# Patient Record
Sex: Female | Born: 1965 | Race: Black or African American | Hispanic: No | Marital: Single | Smoking: Former smoker
Health system: Southern US, Community
[De-identification: ages and names within clinical notes are randomized; demographics above are authoritative.]

## PROBLEM LIST (undated history)

## (undated) DIAGNOSIS — C801 Malignant (primary) neoplasm, unspecified: Secondary | ICD-10-CM

## (undated) DIAGNOSIS — M1990 Unspecified osteoarthritis, unspecified site: Secondary | ICD-10-CM

## (undated) DIAGNOSIS — E119 Type 2 diabetes mellitus without complications: Secondary | ICD-10-CM

## (undated) DIAGNOSIS — I10 Essential (primary) hypertension: Secondary | ICD-10-CM

## (undated) HISTORY — DX: Malignant (primary) neoplasm, unspecified: C80.1

## (undated) HISTORY — PX: ANKLE SURGERY: SHX546

---

## 2015-06-02 ENCOUNTER — Encounter (HOSPITAL_COMMUNITY): Payer: Self-pay | Admitting: Emergency Medicine

## 2015-06-02 ENCOUNTER — Emergency Department (HOSPITAL_COMMUNITY)
Admission: EM | Admit: 2015-06-02 | Discharge: 2015-06-02 | Disposition: A | Discharge status: Home / Self Care | Payer: BLUE CROSS/BLUE SHIELD | Attending: Emergency Medicine | Admitting: Emergency Medicine

## 2015-06-02 ENCOUNTER — Emergency Department (HOSPITAL_COMMUNITY): Payer: BLUE CROSS/BLUE SHIELD

## 2015-06-02 DIAGNOSIS — J111 Influenza due to unidentified influenza virus with other respiratory manifestations: Secondary | ICD-10-CM | POA: Insufficient documentation

## 2015-06-02 DIAGNOSIS — R05 Cough: Secondary | ICD-10-CM | POA: Diagnosis present

## 2015-06-02 DIAGNOSIS — Z7901 Long term (current) use of anticoagulants: Secondary | ICD-10-CM | POA: Insufficient documentation

## 2015-06-02 DIAGNOSIS — Z79899 Other long term (current) drug therapy: Secondary | ICD-10-CM | POA: Insufficient documentation

## 2015-06-02 DIAGNOSIS — R197 Diarrhea, unspecified: Secondary | ICD-10-CM | POA: Diagnosis not present

## 2015-06-02 DIAGNOSIS — R6889 Other general symptoms and signs: Secondary | ICD-10-CM

## 2015-06-02 DIAGNOSIS — Z87891 Personal history of nicotine dependence: Secondary | ICD-10-CM | POA: Diagnosis not present

## 2015-06-02 DIAGNOSIS — E119 Type 2 diabetes mellitus without complications: Secondary | ICD-10-CM | POA: Insufficient documentation

## 2015-06-02 DIAGNOSIS — I10 Essential (primary) hypertension: Secondary | ICD-10-CM | POA: Diagnosis not present

## 2015-06-02 DIAGNOSIS — Z791 Long term (current) use of non-steroidal anti-inflammatories (NSAID): Secondary | ICD-10-CM | POA: Insufficient documentation

## 2015-06-02 HISTORY — DX: Essential (primary) hypertension: I10

## 2015-06-02 HISTORY — DX: Type 2 diabetes mellitus without complications: E11.9

## 2015-06-02 LAB — URINALYSIS, ROUTINE W REFLEX MICROSCOPIC
Bilirubin Urine: NEGATIVE
Glucose, UA: NEGATIVE mg/dL
Leukocytes, UA: NEGATIVE
Nitrite: NEGATIVE
Protein, ur: 30 mg/dL — AB
Specific Gravity, Urine: >1.03 — ABNORMAL HIGH (ref 1.005–1.030)
pH: 5.5 (ref 5.0–8.0)

## 2015-06-02 LAB — CBC WITH DIFFERENTIAL/PLATELET
Basophils Absolute: 0 10*3/uL (ref 0.0–0.1)
Basophils Relative: 1 %
Eosinophils Absolute: 0.1 10*3/uL (ref 0.0–0.7)
Eosinophils Relative: 4 %
HCT: 39 % (ref 36.0–46.0)
Hemoglobin: 12.9 g/dL (ref 12.0–15.0)
Lymphocytes Relative: 51 %
Lymphs Abs: 1.7 10*3/uL (ref 0.7–4.0)
MCH: 27.4 pg (ref 26.0–34.0)
MCHC: 33.1 g/dL (ref 30.0–36.0)
MCV: 83 fL (ref 78.0–100.0)
Monocytes Absolute: 0.6 10*3/uL (ref 0.1–1.0)
Monocytes Relative: 18 %
Neutro Abs: 0.9 10*3/uL — ABNORMAL LOW (ref 1.7–7.7)
Neutrophils Relative %: 26 %
Platelets: 274 10*3/uL (ref 150–400)
RBC: 4.7 MIL/uL (ref 3.87–5.11)
RDW: 13.9 % (ref 11.5–15.5)
WBC: 3.4 10*3/uL — ABNORMAL LOW (ref 4.0–10.5)

## 2015-06-02 LAB — COMPREHENSIVE METABOLIC PANEL
ALT: 14 U/L (ref 14–54)
AST: 17 U/L (ref 15–41)
Albumin: 3.9 g/dL (ref 3.5–5.0)
Alkaline Phosphatase: 48 U/L (ref 38–126)
Anion gap: 6 (ref 5–15)
BUN: 12 mg/dL (ref 6–20)
CO2: 27 mmol/L (ref 22–32)
Calcium: 9.5 mg/dL (ref 8.9–10.3)
Chloride: 105 mmol/L (ref 101–111)
Creatinine, Ser: 0.87 mg/dL (ref 0.44–1.00)
GFR calc Af Amer: >60 mL/min (ref >60)
GFR calc non Af Amer: >60 mL/min (ref >60)
Glucose, Bld: 106 mg/dL — ABNORMAL HIGH (ref 65–99)
Potassium: 4.4 mmol/L (ref 3.5–5.1)
Sodium: 138 mmol/L (ref 135–145)
Total Bilirubin: 0.4 mg/dL (ref 0.3–1.2)
Total Protein: 7.6 g/dL (ref 6.5–8.1)

## 2015-06-02 LAB — URINE MICROSCOPIC-ADD ON

## 2015-06-02 LAB — LIPASE, BLOOD: Lipase: 23 U/L (ref 11–51)

## 2015-06-02 MED ORDER — DIPHENOXYLATE-ATROPINE 2.5-0.025 MG PO TABS
2.0000 | ORAL_TABLET | Freq: Once | ORAL | Status: AC
Start: 2015-06-02 — End: 2015-06-02
  Administered 2015-06-02: 2 via ORAL
  Filled 2015-06-02: qty 2

## 2015-06-02 MED ORDER — ALBUTEROL SULFATE HFA 108 (90 BASE) MCG/ACT IN AERS
2.0000 | INHALATION_SPRAY | Freq: Once | RESPIRATORY_TRACT | Status: AC
  Administered 2015-06-02: 2 via RESPIRATORY_TRACT
  Filled 2015-06-02: qty 6.7

## 2015-06-02 MED ORDER — DIPHENOXYLATE-ATROPINE 2.5-0.025 MG PO TABS: 1.0000 | ORAL_TABLET | Freq: Four times a day (QID) | ORAL | Status: DC | PRN

## 2015-06-02 NOTE — ED Provider Notes (Signed)
CSN: [id number]     Arrival date & time 06/02/15  1036 History   First MD Initiated Contact with Patient 06/02/15 1152     Chief Complaint  Patient presents with  . Cough     (Consider location/radiation/quality/duration/timing/severity/associated sxs/prior Treatment) The history is provided by the patient.   Brittany Mack is a 50 y.o. female presenting with a nearly 2 week history of URI type symptoms which has progressed into acute bronchitis.  She endorses nasal congestion with clear rhinorrhea initially along with a mild sore throat and since then her upper respiratory symptoms have improved but she has developed a wet sounding cough which is occasionally productive of a yellow sputum.  In addition she has burning for chest pain with cough only.  She denies hemoptysis.  She was seen by a provider at an outside ER on Monday and was prescribed a Z-Pak along with Tessalon and Phenergan with codeine cough syrup but endorses no improvement in her symptoms.  She denies nausea or vomiting but has developed diarrhea since yesterday stating having brown watery stool every 15 minutes for the past 24 hours.  She endorses general weakness and fatigue.  She is able to tolerate by mouth intake without worsening symptoms.  She has taken no medications for her diarrhea.  She is a diabetic which is diet controlled.     Past Medical History  Diagnosis Date  . Hypertension   . Diabetes mellitus without complication [facility])    Past Surgical History  Procedure Laterality Date  . Cesarean section    . Ankle surgery      right   Family History  Problem Relation Age of Onset  . Rheum arthritis Mother   . Hypertension Mother   . Stroke Father   . Heart failure Father   . Hypertension Sister   . Diabetes Brother    Social History  Substance Use Topics  . Smoking status: Former Smoker    Quit date: 06/02/1982  . Smokeless tobacco: None  . Alcohol Use: No   OB History    No data available      Review of Systems  Constitutional: Positive for fatigue. Negative for fever.  HENT: Negative for congestion and sore throat.   Eyes: Negative.   Respiratory: Positive for cough. Negative for chest tightness, shortness of breath, wheezing and stridor.   Cardiovascular: Negative for chest pain and palpitations.  Gastrointestinal: Positive for diarrhea. Negative for nausea, vomiting and abdominal pain.  Genitourinary: Negative.   Musculoskeletal: Negative for joint swelling, arthralgias and neck pain.  Skin: Negative.  Negative for rash and wound.  Neurological: Positive for weakness. Negative for dizziness, light-headedness, numbness and headaches.  Psychiatric/Behavioral: Negative.       Allergies  Review of patient's allergies indicates no known allergies.  Home Medications   Prior to Admission medications   Medication Sig Start Date End Date Taking? Authorizing Provider  amlodipine-olmesartan (AZOR) 10-20 MG tablet Take 1 tablet by mouth daily.   Yes Historical Provider, MD  azithromycin (ZITHROMAX) 250 MG tablet Take 250 mg by mouth daily.   Yes Historical Provider, MD  benzonatate (TESSALON) 100 MG capsule Take 100 mg by mouth every 8 (eight) hours as needed for cough.   Yes Historical Provider, MD  ibuprofen (ADVIL,MOTRIN) 600 MG tablet Take 600 mg by mouth every 6 (six) hours as needed for moderate pain.   Yes Historical Provider, MD  naproxen sodium (ANAPROX) 220 MG tablet Take 440 mg by mouth daily as needed (  pain).   Yes Historical Provider, MD  promethazine-dextromethorphan (PROMETHAZINE-DM) 6.25-15 MG/5ML syrup Take 10 mLs by mouth every 6 (six) hours as needed for cough.   Yes Historical Provider, MD  diphenoxylate-atropine (LOMOTIL) 2.5-0.025 MG tablet Take 1 tablet by mouth 4 (four) times daily as needed for diarrhea or loose stools. 06/02/15   Evalee Jefferson, PA-C   BP 125/77 mmHg  Pulse 74  Temp(Src) 98.2 F (36.8 C) (Oral)  Resp 16  Ht 5\' 6"  (1.676 m)  Wt 88.451 kg   BMI 31.49 kg/m2  SpO2 90% Physical Exam  Constitutional: She appears well-developed and well-nourished.  HENT:  Head: Normocephalic and atraumatic.  Eyes: Conjunctivae are normal.  Neck: Normal range of motion.  Cardiovascular: Normal rate, regular rhythm, normal heart sounds and intact distal pulses.   Pulmonary/Chest: Effort normal. She has no decreased breath sounds. She has wheezes in the left upper field. She has no rhonchi. She has no rales.  Localized wheeze clears with cough.  Abdominal: Soft. Bowel sounds are normal. There is no tenderness.  Musculoskeletal: Normal range of motion.  Neurological: She is alert.  Skin: Skin is warm and dry.  Psychiatric: She has a normal mood and affect.  Nursing note and vitals reviewed.   ED Course  Procedures (including critical care time) Labs Review Labs Reviewed  COMPREHENSIVE METABOLIC PANEL - Abnormal; Notable for the following:    Glucose, Bld 106 (*)    All other components within normal limits  CBC WITH DIFFERENTIAL/PLATELET - Abnormal; Notable for the following:    WBC 3.4 (*)    Neutro Abs 0.9 (*)    All other components within normal limits  URINALYSIS, ROUTINE W REFLEX MICROSCOPIC (NOT AT [facility]) - Abnormal; Notable for the following:    Specific Gravity, Urine >1.030 (*)    Hgb urine dipstick LARGE (*)    Ketones, ur TRACE (*)    Protein, ur 30 (*)    All other components within normal limits  URINE MICROSCOPIC-ADD ON - Abnormal; Notable for the following:    Squamous Epithelial / LPF 6-30 (*)    Bacteria, UA MANY (*)    All other components within normal limits  URINE CULTURE  LIPASE, BLOOD    Imaging Review Dg Abd Acute W/chest  06/02/2015  CLINICAL DATA:  Productive cough and shortness of breath for 2 weeks EXAM: DG ABDOMEN ACUTE W/ 1V CHEST COMPARISON:  None. FINDINGS: There is no evidence of dilated bowel loops or free intraperitoneal air. No radiopaque calculi or other significant radiographic abnormality is  seen. Heart size and mediastinal contours are within normal limits. Both lungs are clear. IMPRESSION: Negative abdominal radiographs.  No acute cardiopulmonary disease. Electronically Signed   By: Inez Catalina M.D.   On: 06/02/2015 12:56   I have personally reviewed and evaluated these images and lab results as part of my medical decision-making.   EKG Interpretation None      MDM   Final diagnoses:  Flu-like symptoms  Diarrhea, unspecified type    Patients  labs reviewed.  Radiological studies were viewed, interpreted and considered during the medical decision making and disposition process. I agree with radiologists reading.  Results were also discussed with patient. No signs of either pneumonia or acute bronchitis on todays imaging.  Suspect diarrhea may be side effect of her zithromax.  Lomotil given, advised to hold zpack.  Do not feel indicated and is causing undo side effects, although has had no diarrhea since arriving here.  Prescribed lomotil,  first dose given here, advised continue to take only if diarrhea persists.  Yogurt. Albuterol mdi given, q 4 hours prn.  Rest, increased fluid intake.    Discussed urine results, suspect contamination, cx sent.  Denies urinary sx.  The patient appears reasonably screened and/or stabilized for discharge and I doubt any other medical condition or other [facility] requiring further screening, evaluation, or treatment in the ED at this time prior to discharge.      Evalee Jefferson, PA-C 06/02/15 [place], DO 06/06/15 [phone]

## 2015-06-02 NOTE — ED Notes (Signed)
Having cold symptoms for 2 weeks/  Seen at [facility] on Friday and treated couagh medication and motrin.  Continued to have symptoms and was treated with antibiotic on Monday.  Pt here because is still not feeling any better.  Pt don't have PCP.  Cough (yellowish), diarrhea, chills, weak and headache occasionally.  Did not take antibiotic this am but did yesterday morning.

## 2015-06-02 NOTE — Discharge Instructions (Signed)
Diarrhea Diarrhea is frequent loose and watery bowel movements. It can cause you to feel weak and dehydrated. Dehydration can cause you to become tired and thirsty, have a dry mouth, and have decreased urination that often is dark yellow. Diarrhea is a sign of another problem, most often an infection that will not last long. In most cases, diarrhea typically lasts 2-3 days. However, it can last longer if it is a sign of something more serious. It is important to treat your diarrhea as directed by your caregiver to lessen or prevent future episodes of diarrhea. CAUSES  Some common causes include:  Gastrointestinal infections caused by viruses, bacteria, or parasites.  Food poisoning or food allergies.  Certain medicines, such as antibiotics, chemotherapy, and laxatives.  Artificial sweeteners and fructose.  Digestive disorders. HOME CARE INSTRUCTIONS  Ensure adequate fluid intake (hydration): Have 1 cup (8 oz) of fluid for each diarrhea episode. Avoid fluids that contain simple sugars or sports drinks, fruit juices, whole milk products, and sodas. Your urine should be clear or pale yellow if you are drinking enough fluids. Hydrate with an oral rehydration solution that you can purchase at pharmacies, retail stores, and online. You can prepare an oral rehydration solution at home by mixing the following ingredients together:   - tsp table salt.   tsp baking soda.   tsp salt substitute containing potassium chloride.  1  tablespoons sugar.  1 L (34 oz) of water.  Certain foods and beverages may increase the speed at which food moves through the gastrointestinal (GI) tract. These foods and beverages should be avoided and include:  Caffeinated and alcoholic beverages.  High-fiber foods, such as raw fruits and vegetables, nuts, seeds, and whole grain breads and cereals.  Foods and beverages sweetened with sugar alcohols, such as xylitol, sorbitol, and mannitol.  Some foods may be well  tolerated and may help thicken stool including:  Starchy foods, such as rice, toast, pasta, low-sugar cereal, oatmeal, grits, baked potatoes, crackers, and bagels.  Bananas.  Applesauce.  Add probiotic-rich foods to help increase healthy bacteria in the GI tract, such as yogurt and fermented milk products.  Wash your hands well after each diarrhea episode.  Only take over-the-counter or prescription medicines as directed by your caregiver.  Take a warm bath to relieve any burning or pain from frequent diarrhea episodes. SEEK IMMEDIATE MEDICAL CARE IF:   You are unable to keep fluids down.  You have persistent vomiting.  You have blood in your stool, or your stools are black and tarry.  You do not urinate in 6-8 hours, or there is only a small amount of very dark urine.  You have abdominal pain that increases or localizes.  You have weakness, dizziness, confusion, or light-headedness.  You have a severe headache.  Your diarrhea gets worse or does not get better.  You have a fever or persistent symptoms for more than 2-3 days.  You have a fever and your symptoms suddenly get worse. MAKE SURE YOU:   Understand these instructions.  Will watch your condition.  Will get help right away if you are not doing well or get worse.   This information is not intended to replace advice given to you by your health care provider. Make sure you discuss any questions you have with your health care provider.   Document Released: 03/02/2002 Document Revised: 04/02/2014 Document Reviewed: 11/18/2011 Elsevier Interactive Patient Education [employer].   As discussed, I believe your diarrhea may be due to  the antibiotic.  As your chest x-ray today is clear, I recommend holding your last 2 doses of Zithromax.  You may continue to take the Lomotil prescribed today but only if you continue to have diarrhea as his medication will make you constipated if you continue to use it without  continued diarrhea symptoms.  I recommend eating a cup of yogurt daily which may also help with your diarrhea.  Use the albuterol inhaler 2 puffs every 4 hours if you are coughing or wheezing.  Plan a recheck if your symptoms persist or worsen.  Your lab tests and x-rays are okay today.  As discussed your urine has been sent for culture and this test will take 2 days to return.  You'll be notified if you need any further medications depending on the results of this culture.

## 2015-06-02 NOTE — ED Notes (Signed)
RT paged.

## 2015-06-05 LAB — URINE CULTURE
Culture: 60000
Special Requests: NORMAL

## 2015-06-06 ENCOUNTER — Telehealth (HOSPITAL_BASED_OUTPATIENT_CLINIC_OR_DEPARTMENT_OTHER): Payer: Self-pay | Admitting: Emergency Medicine

## 2015-06-06 NOTE — Telephone Encounter (Signed)
Post ED Visit - Positive Culture Follow-up: Successful Patient Follow-Up  Culture assessed and recommendations reviewed by: []  Elenor Quinones, Pharm.D. []  Heide Guile, Pharm.D., BCPS []  Parks Neptune, Pharm.D. []  Alycia Rossetti, Pharm.D., BCPS []  [place], Pharm.D., BCPS, AAHIVP []  Legrand Como, Pharm.D., BCPS, AAHIVP []  Milus Glazier, Pharm.D. []  Rob Tropic, [place].Daiva Huge PharmD  Positive urine culture E. Coli 60,000 colonies  [x]  Patient discharged without antimicrobial prescription and treatment is now indicated []  Organism is resistant to prescribed ED discharge antimicrobial []  Patient with positive blood cultures  Changes discussed with ED provider: symptom check, if symptoms of UTI, give Bactrim DS 1 tab bid x  3 days  Attempting to call patient    Hazle Nordmann 06/06/2015, 12:35 PM

## 2015-06-06 NOTE — Progress Notes (Signed)
ED Antimicrobial Stewardship Positive Culture Follow Up   Brittany Mack is an 50 y.o. female who presented to [facility] on 06/02/2015 with a chief complaint of  Chief Complaint  Patient presents with  . Cough    Recent Results (from the past 720 hour(s))  Urine culture     Status: None   Collection Time: 06/02/15  1:42 PM  Result Value Ref Range Status   Specimen Description URINE, RANDOM  Final   Special Requests Normal  Final   Culture   Final    60,000 COLONIES/ml ESCHERICHIA COLI Performed at [facility]    Report Status 06/05/2015 FINAL  Final   Organism ID, Bacteria ESCHERICHIA COLI  Final      Susceptibility   Escherichia coli - MIC*    AMPICILLIN <=2 SENSITIVE Sensitive     CEFAZOLIN <=4 SENSITIVE Sensitive     CEFTRIAXONE <=1 SENSITIVE Sensitive     CIPROFLOXACIN <=0.25 SENSITIVE Sensitive     GENTAMICIN <=1 SENSITIVE Sensitive     IMIPENEM <=0.25 SENSITIVE Sensitive     NITROFURANTOIN <=16 SENSITIVE Sensitive     TRIMETH/SULFA <=20 SENSITIVE Sensitive     AMPICILLIN/SULBACTAM <=2 SENSITIVE Sensitive     PIP/TAZO <=4 SENSITIVE Sensitive     * 60,000 COLONIES/ml ESCHERICHIA COLI    [x]  Treated with azithromycin, organism resistant to prescribed antimicrobial   New antibiotic prescription: Call and check on symptoms. If having UTI symptoms, give Bactrim DS 1 tab BID x 3 days. If no symptoms, pt does not need treatment.   ED Provider: Arlean Hopping, PA-C   Brittany Mack 06/06/2015, 10:13 AM Infectious Diseases Pharmacist Phone# [phone]

## 2016-12-07 ENCOUNTER — Encounter (HOSPITAL_COMMUNITY): Payer: Self-pay | Admitting: Emergency Medicine

## 2016-12-07 ENCOUNTER — Emergency Department (HOSPITAL_COMMUNITY)
Admission: EM | Admit: 2016-12-07 | Discharge: 2016-12-07 | Disposition: A | Discharge status: Home / Self Care | Payer: BLUE CROSS/BLUE SHIELD | Attending: Emergency Medicine | Admitting: Emergency Medicine

## 2016-12-07 DIAGNOSIS — M25561 Pain in right knee: Secondary | ICD-10-CM | POA: Insufficient documentation

## 2016-12-07 DIAGNOSIS — Z79899 Other long term (current) drug therapy: Secondary | ICD-10-CM | POA: Insufficient documentation

## 2016-12-07 DIAGNOSIS — E119 Type 2 diabetes mellitus without complications: Secondary | ICD-10-CM | POA: Insufficient documentation

## 2016-12-07 DIAGNOSIS — M25562 Pain in left knee: Secondary | ICD-10-CM | POA: Insufficient documentation

## 2016-12-07 DIAGNOSIS — Z87891 Personal history of nicotine dependence: Secondary | ICD-10-CM | POA: Insufficient documentation

## 2016-12-07 DIAGNOSIS — I10 Essential (primary) hypertension: Secondary | ICD-10-CM | POA: Insufficient documentation

## 2016-12-07 MED ORDER — ONDANSETRON HCL 4 MG PO TABS
4.0000 mg | ORAL_TABLET | Freq: Once | ORAL | Status: AC
  Administered 2016-12-07: 4 mg via ORAL
  Filled 2016-12-07: qty 1

## 2016-12-07 MED ORDER — INDOMETHACIN 25 MG PO CAPS
25.0000 mg | ORAL_CAPSULE | Freq: Once | ORAL | Status: AC
  Administered 2016-12-07: 25 mg via ORAL
  Filled 2016-12-07: qty 1

## 2016-12-07 MED ORDER — DICLOFENAC SODIUM 75 MG PO TBEC
75.0000 mg | DELAYED_RELEASE_TABLET | Freq: Two times a day (BID) | ORAL | 0 refills | Status: DC
Start: 2016-12-07 — End: 2018-12-12

## 2016-12-07 MED ORDER — DEXAMETHASONE 4 MG PO TABS: 4.0000 mg | ORAL_TABLET | Freq: Two times a day (BID) | ORAL | 0 refills | Status: DC

## 2016-12-07 MED ORDER — ACETAMINOPHEN 500 MG PO TABS
1000.0000 mg | ORAL_TABLET | Freq: Once | ORAL | Status: AC
  Administered 2016-12-07: 1000 mg via ORAL
  Filled 2016-12-07: qty 2

## 2016-12-07 NOTE — ED Notes (Signed)
Pt states understanding of care given and follow up instructions.  Pt a/o ambulated from ED with steady gait 

## 2016-12-07 NOTE — Discharge Instructions (Signed)
Your examination suggest arthritis involving her knees. Please use diclofenac and Decadron 2 times daily with food. Please see Dr. Luna Glasgow for orthopedic evaluation and management concerning your knees. Please monitor your glucoses carefully while on the Decadron.

## 2016-12-07 NOTE — ED Provider Notes (Signed)
[place] DEPT Provider Note   CSN: [id number] Arrival date & time: 12/07/16  1849     History   Chief Complaint Chief Complaint  Patient presents with  . Knee Pain    HPI Brittany Mack is a 51 y.o. female.   Knee Pain   This is a chronic problem. The current episode started more than 1 week ago. The problem occurs daily. The problem has been gradually worsening. The pain is present in the right knee and left knee. The quality of the pain is described as aching. The pain is moderate. Associated symptoms include limited range of motion and stiffness. The symptoms are aggravated by standing. She has tried OTC pain medications for the symptoms. The treatment provided no relief. There has been no history of extremity trauma. Family history is significant for rheumatoid arthritis.    Past Medical History:  Diagnosis Date  . Diabetes mellitus without complication ([place])   . Hypertension     There are no active problems to display for this patient.   Past Surgical History:  Procedure Laterality Date  . ANKLE SURGERY     right  . CESAREAN SECTION      OB History    No data available       Home Medications    Prior to Admission medications   Medication Sig Start Date End Date Taking? Authorizing Provider  amlodipine-olmesartan (AZOR) 10-20 MG tablet Take 1 tablet by mouth daily.    [provider]  azithromycin (ZITHROMAX) 250 MG tablet Take 250 mg by mouth daily.    [provider]  benzonatate (TESSALON) 100 MG capsule Take 100 mg by mouth every 8 (eight) hours as needed for cough.    [provider]  dexamethasone (DECADRON) 4 MG tablet Take 1 tablet (4 mg total) by mouth 2 (two) times daily with a meal. 12/07/16   Lily Kocher, PA-C  diclofenac (VOLTAREN) 75 MG EC tablet Take 1 tablet (75 mg total) by mouth 2 (two) times daily. 12/07/16   Lily Kocher, PA-C  diphenoxylate-atropine (LOMOTIL) 2.5-0.025 MG tablet Take 1 tablet by mouth 4  (four) times daily as needed for diarrhea or loose stools. 06/02/15   Evalee Jefferson, PA-C  ibuprofen (ADVIL,MOTRIN) 600 MG tablet Take 600 mg by mouth every 6 (six) hours as needed for moderate pain.    [provider]  naproxen sodium (ANAPROX) 220 MG tablet Take 440 mg by mouth daily as needed (pain).    [provider]  promethazine-dextromethorphan (PROMETHAZINE-DM) 6.25-15 MG/5ML syrup Take 10 mLs by mouth every 6 (six) hours as needed for cough.    [provider]    Family History Family History  Problem Relation Age of Onset  . Rheum arthritis Mother   . Hypertension Mother   . Stroke Father   . Heart failure Father   . Hypertension Sister   . Diabetes Brother     Social History Social History  Substance Use Topics  . Smoking status: Former Smoker    Quit date: 06/02/1982  . Smokeless tobacco: Never Used  . Alcohol use No     Allergies   Patient has no known allergies.   Review of Systems Review of Systems  Constitutional: Negative for activity change.       All ROS Neg except as noted in HPI  HENT: Negative for nosebleeds.   Eyes: Negative for photophobia and discharge.  Respiratory: Negative for cough, shortness of breath and wheezing.   Cardiovascular: Negative for  chest pain and palpitations.  Gastrointestinal: Negative for abdominal pain and blood in stool.  Genitourinary: Negative for dysuria, frequency and hematuria.  Musculoskeletal: Positive for stiffness. Negative for arthralgias, back pain and neck pain.  Skin: Negative.   Neurological: Negative for dizziness, seizures and speech difficulty.  Psychiatric/Behavioral: Negative for confusion and hallucinations.     Physical Exam Updated Vital Signs BP (!) 163/89 (BP Location: Right Arm)   Pulse 76   Temp 98.2 F (36.8 C) (Oral)   Resp 16   Wt 88.5 kg (195 lb)   SpO2 100%   BMI 31.47 kg/m   Physical Exam  Constitutional: She is oriented to person, place, and time. She  appears well-developed and well-nourished.  Non-toxic appearance.  HENT:  Head: Normocephalic.  Right Ear: Tympanic membrane and external ear normal.  Left Ear: Tympanic membrane and external ear normal.  Eyes: Pupils are equal, round, and reactive to light. EOM and lids are normal.  Neck: Normal range of motion. Neck supple. Carotid bruit is not present.  Cardiovascular: Normal rate, regular rhythm, normal heart sounds, intact distal pulses and normal pulses.   Pulmonary/Chest: Breath sounds normal. No respiratory distress.  Abdominal: Soft. Bowel sounds are normal. There is no tenderness. There is no guarding.  Musculoskeletal:  Right and left knees are warm but not hot. There is crepitus of right and left knee. There is no deformity of the quadricep area, or the anterior tibial tuberosity. There is no posterior mass. There is full range of motion of the right and left ankle. The Achilles tendon's are intact.  Lymphadenopathy:       Head (right side): No submandibular adenopathy present.       Head (left side): No submandibular adenopathy present.    She has no cervical adenopathy.  Neurological: She is alert and oriented to person, place, and time. She has normal strength. No cranial nerve deficit or sensory deficit.  Skin: Skin is warm and dry.  Psychiatric: She has a normal mood and affect. Her speech is normal.  Nursing note and vitals reviewed.    ED Treatments / Results  Labs (all labs ordered are listed, but only abnormal results are displayed) Labs Reviewed - No data to display  EKG  EKG Interpretation None       Radiology No results found.  Procedures Procedures (including critical care time)  Medications Ordered in ED Medications  indomethacin (INDOCIN) capsule 25 mg (not administered)  ondansetron (ZOFRAN) tablet 4 mg (not administered)  acetaminophen (TYLENOL) tablet 1,000 mg (not administered)     Initial Impression / Assessment and Plan / ED Course  I  have reviewed the triage vital signs and the nursing notes.  Pertinent labs & imaging results that were available during my care of the patient were reviewed by me and considered in my medical decision making (see chart for details).      Final Clinical Impressions(s) / ED Diagnoses MDM Blood pressure is elevated at 160/98. Otherwise the vital signs are essentially within normal limits. 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 Patient works as a Automotive engineer, and has to do a lot of ending, standing, and walking.  Patient fitted with knee sleeves bilaterally. The patient will use the conjunctiva and diclofenac. Patient to follow-up with orthopedics as an outpatient.    Final diagnoses:  Pain in both knees, unspecified chronicity    New Prescriptions New Prescriptions   DEXAMETHASONE (DECADRON) 4 MG TABLET    Take 1  tablet (4 mg total) by mouth 2 (two) times daily with a meal.   DICLOFENAC (VOLTAREN) 75 MG EC TABLET    Take 1 tablet (75 mg total) by mouth 2 (two) times daily.     Lily Kocher, PA-C 12/08/16 1559    Milton Ferguson, MD 12/08/16 [phone]

## 2016-12-07 NOTE — ED Triage Notes (Signed)
Pt c/o bilateral knne pain for months with bilateral knee swelling x 1 weeks.

## 2017-01-07 ENCOUNTER — Ambulatory Visit: Payer: Self-pay | Admitting: Orthopedic Surgery

## 2017-01-11 ENCOUNTER — Ambulatory Visit (INDEPENDENT_AMBULATORY_CARE_PROVIDER_SITE_OTHER): Payer: BLUE CROSS/BLUE SHIELD

## 2017-01-11 ENCOUNTER — Ambulatory Visit: Payer: BLUE CROSS/BLUE SHIELD

## 2017-01-11 ENCOUNTER — Ambulatory Visit (INDEPENDENT_AMBULATORY_CARE_PROVIDER_SITE_OTHER): Payer: BLUE CROSS/BLUE SHIELD | Admitting: Orthopedic Surgery

## 2017-01-11 VITALS — BP 161/111 | HR 66 | Ht 67.0 in | Wt 213.0 lb

## 2017-01-11 DIAGNOSIS — M25561 Pain in right knee: Secondary | ICD-10-CM

## 2017-01-11 DIAGNOSIS — M25562 Pain in left knee: Principal | ICD-10-CM

## 2017-01-11 MED ORDER — MELOXICAM 7.5 MG PO TABS
7.5000 mg | ORAL_TABLET | Freq: Every day | ORAL | 1 refills | Status: DC
Start: 2017-01-11 — End: 2017-04-21

## 2017-01-11 NOTE — Patient Instructions (Addendum)
Physical therapy has been ordered for you at [facility] will get a call to schedule . Please let us know if you do not hear anything within one week.

## 2017-01-11 NOTE — Progress Notes (Signed)
NEW PATIENT OFFICE VISIT    Chief Complaint  Patient presents with  . Follow-up    ER follow up on knee pain.    51 year old female presents with bilateral knee pain. She fell and injured her left knee several months ago. That originally got better but now both knees are hurting stiff occasional swelling with mild dull constant pain    Review of Systems  Constitutional: Negative for fever.  Musculoskeletal: Positive for joint pain.  Skin: Negative.   Neurological: Negative for tingling and sensory change.     Past Medical History:  Diagnosis Date  . Diabetes mellitus without complication ([place])   . Hypertension     Past Surgical History:  Procedure Laterality Date  . ANKLE SURGERY     right  . CESAREAN SECTION      Family History  Problem Relation Age of Onset  . Rheum arthritis Mother   . Hypertension Mother   . Stroke Father   . Heart failure Father   . Hypertension Sister   . Diabetes Brother    Social History  Substance Use Topics  . Smoking status: Former Smoker    Quit date: 06/02/1982  . Smokeless tobacco: Never Used  . Alcohol use No    @ALL @  No outpatient prescriptions have been marked as taking for the 01/11/17 encounter (Office Visit) with Carole Civil, MD.    BP (!) 161/111   Pulse 66   Ht 5\' 7"  (1.702 m)   Wt 213 lb (96.6 kg)   BMI 33.36 kg/m   Physical Exam  Constitutional: She is oriented to person, place, and time and well-developed, well-nourished, and in no distress. No distress.  Cardiovascular: Intact distal pulses.   Musculoskeletal:       Left knee: Medial joint line tenderness noted.  Neurological: She is alert and oriented to person, place, and time. She displays normal reflexes. She exhibits normal muscle tone. Gait normal. Coordination normal.  Skin: She is not diaphoretic.  Psychiatric: Mood, memory, affect and judgment normal.    Physical Exam  Constitutional: She is oriented to person, place, and time and  well-developed, well-nourished, and in no distress. No distress.  Cardiovascular: Intact distal pulses.   Musculoskeletal:       Left knee: Medial joint line tenderness noted.  Neurological: She is alert and oriented to person, place, and time. She displays normal reflexes. She exhibits normal muscle tone. Gait normal. Coordination normal.  Skin: She is not diaphoretic.  Psychiatric: Mood, memory, affect and judgment normal.    Left Knee Exam  Swelling: None Effusion: No  Tenderness  The patient is experiencing tenderness in the medial joint line.  Range of Motion  Extension: Normal Flexion:     130  Muscle Strength  Normal left knee strength  Tests  McMurrays:  Medial - Positive       Lachman:  Anterior - Negative     Drawer:       Anterior - Negative     Posterior - Negative Varus:  Negative Valgus: Negative  Comments:  NEUROVASCULAR EXAM INTACT   Right Knee Exam  Effusion: Yes  Tenderness  None  Range of Motion  Normal right knee ROM Extension: Normal Flexion:     130  Muscle Strength  Normal right knee strength  Tests  McMurrays:  Medial - Negative       Drawer:       Anterior - Negative    Posterior - Negative  Comments:  NEUROVASCULAR EXAM INTACT      No orders of the defined types were placed in this encounter. X-rays were obtained of both knees. The left knee showed mild arthritis as did the right please see my report  There was an effusion in the right knee as well  Encounter Diagnosis  Name Primary?  . Pain in both knees, unspecified chronicity Yes     PLAN:   MOBIC X 4 WEEKS   PT THEN RETURN , ? MRI

## 2017-04-21 ENCOUNTER — Other Ambulatory Visit: Payer: Self-pay | Admitting: Orthopedic Surgery

## 2017-07-03 ENCOUNTER — Ambulatory Visit: Payer: Self-pay | Admitting: Orthopedic Surgery

## 2017-07-03 NOTE — Progress Notes (Deleted)
  NEW PATIENT OFFICE VISIT   No chief complaint on file.   HPI  ROS   Past Medical History:  Diagnosis Date  . Diabetes mellitus without complication ([place])   . Hypertension     Past Surgical History:  Procedure Laterality Date  . ANKLE SURGERY     right  . CESAREAN SECTION      Family History  Problem Relation Age of Onset  . Rheum arthritis Mother   . Hypertension Mother   . Stroke Father   . Heart failure Father   . Hypertension Sister   . Diabetes Brother    Social History   Tobacco Use  . Smoking status: Former Smoker    Last attempt to quit: 06/02/1982    Years since quitting: 35.1  . Smokeless tobacco: Never Used  Substance Use Topics  . Alcohol use: No  . Drug use: No    @ALL @  No outpatient medications have been marked as taking for the 07/03/17 encounter (Appointment) with Carole Civil, MD.    There were no vitals taken for this visit.  Physical Exam  Ortho Exam  MEDICAL DECISION SECTION  xrays ordered? ***  My independent reading of xrays: ***   No diagnosis found.   PLAN:   No orders of the defined types were placed in this encounter.  Injection? *** MRI/CT/? ***

## 2017-07-22 ENCOUNTER — Other Ambulatory Visit: Payer: Self-pay | Admitting: Orthopedic Surgery

## 2017-07-22 MED ORDER — MELOXICAM 7.5 MG PO TABS: 7.5000 mg | ORAL_TABLET | Freq: Every day | ORAL | 0 refills | Status: DC

## 2017-07-22 NOTE — Telephone Encounter (Signed)
Patient is requesting Meloxicam (MOBIC) 7.5 MG  Qty 30 Tablets  Take 1 by mouth once daily.  PATIENT USES [facility] [address] [place], [place]  Patient has not been seen since October 19th, 2018. Had appointment on 07/03/2017 was a no show.

## 2017-12-11 ENCOUNTER — Ambulatory Visit: Payer: Self-pay | Admitting: Orthopedic Surgery

## 2017-12-12 ENCOUNTER — Encounter: Payer: Self-pay | Admitting: Orthopedic Surgery

## 2018-11-24 ENCOUNTER — Ambulatory Visit: Payer: Self-pay | Admitting: Orthopedic Surgery

## 2018-12-03 ENCOUNTER — Ambulatory Visit: Payer: Self-pay | Admitting: Orthopedic Surgery

## 2018-12-12 ENCOUNTER — Encounter: Payer: Self-pay | Admitting: Orthopedic Surgery

## 2018-12-12 ENCOUNTER — Other Ambulatory Visit: Payer: Self-pay

## 2018-12-12 ENCOUNTER — Ambulatory Visit: Payer: Self-pay | Admitting: Orthopedic Surgery

## 2018-12-12 VITALS — BP 124/81 | HR 78 | Ht 67.0 in | Wt 215.0 lb

## 2018-12-12 DIAGNOSIS — M654 Radial styloid tenosynovitis [de Quervain]: Secondary | ICD-10-CM

## 2018-12-12 DIAGNOSIS — M25562 Pain in left knee: Secondary | ICD-10-CM

## 2018-12-12 DIAGNOSIS — M25561 Pain in right knee: Secondary | ICD-10-CM

## 2018-12-12 DIAGNOSIS — G8929 Other chronic pain: Secondary | ICD-10-CM

## 2018-12-12 MED ORDER — MELOXICAM 7.5 MG PO TABS: 7.5000 mg | ORAL_TABLET | Freq: Every day | ORAL | 5 refills | Status: DC

## 2018-12-12 NOTE — Patient Instructions (Signed)
De Quervain's Tenosynovitis  De Quervain's tenosynovitis is a condition that causes inflammation of the tendon on the thumb side of the wrist. Tendons are cords of tissue that connect bones to muscles. The tendons in the hand pass through a tunnel called a sheath. A slippery layer of tissue (synovium) lets the tendons move smoothly in the sheath. With de Quervain's tenosynovitis, the sheath swells or thickens, causing friction and pain. The condition is also called de Quervain's disease and de Quervain's syndrome. It occurs most often in women who are 30-50 years old. What are the causes? The exact cause of this condition is not known. It may be associated with overuse of the hand and wrist. What increases the risk? You are more likely to develop this condition if you:  Use your hands far more than normal, especially if you repeat certain movements that involve twisting your hand or using a tight grip.  Are pregnant.  Are a middle-aged woman.  Have rheumatoid arthritis.  Have diabetes. What are the signs or symptoms? The main symptom of this condition is pain on the thumb side of the wrist. The pain may get worse when you grasp something or turn your wrist. Other symptoms may include:  Pain that extends up the forearm.  Swelling of your wrist and hand.  Trouble moving the thumb and wrist.  A sensation of snapping in the wrist.  A bump filled with fluid (cyst) in the area of the pain. How is this diagnosed? This condition may be diagnosed based on:  Your symptoms and medical history.  A physical exam. During the exam, your health care provider may do a simple test (Finkelstein test) that involves pulling your thumb and wrist to see if this causes pain. You may also need to have an X-ray. How is this treated? Treatment for this condition may include:  Avoiding any activity that causes pain and swelling.  Taking medicines. Anti-inflammatory medicines and corticosteroid  injections may be used to reduce inflammation and relieve pain.  Wearing a splint.  Having surgery. This may be needed if other treatments do not work. Once the pain and swelling has gone down:  Physical therapy. This includes stretching and strengthening exercises.  Occupational therapy. This includes adjusting how you move your wrist. Follow these instructions at home: If you have a splint:  Wear the splint as told by your health care provider. Remove it only as told by your health care provider.  Loosen the splint if your fingers tingle, become numb, or turn cold and blue.  Keep the splint clean.  If the splint is not waterproof: ? Do not let it get wet. ? Cover it with a watertight covering when you take a bath or a shower. Managing pain, stiffness, and swelling   Avoid movements and activities that cause pain and swelling in the wrist area.  If directed, put ice on the painful area. This may be helpful after doing activities that involve the sore wrist. ? Put ice in a plastic bag. ? Place a towel between your skin and the bag. ? Leave the ice on for 20 minutes, 2-3 times a day.  Move your fingers often to avoid stiffness and to lessen swelling.  Raise (elevate) the injured area above the level of your heart while you are sitting or lying down. General instructions  Return to your normal activities as told by your health care provider. Ask your health care provider what activities are safe for you.  Take over-the-counter   and prescription medicines only as told by your health care provider.  Keep all follow-up visits as told by your health care provider. This is important. Contact a health care provider if:  Your pain medicine does not help.  Your pain gets worse.  You develop new symptoms. Summary  De Quervain's tenosynovitis is a condition that causes inflammation of the tendon on the thumb side of the wrist.  The condition occurs most often in women who are  30-50 years old.  The exact cause of this condition is not known. It may be associated with overuse of the hand and wrist.  Treatment starts with avoiding activity that causes pain or swelling in the wrist area. Other treatment may include wearing a splint and taking medicine. Sometimes, surgery is needed. This information is not intended to replace advice given to you by your health care provider. Make sure you discuss any questions you have with your health care provider. Document Released: 12/05/2000 Document Revised: 09/12/2017 Document Reviewed: 02/18/2017 Elsevier Patient Education  2020 [employer]  

## 2018-12-12 NOTE — Progress Notes (Signed)
  Chief Complaint  Patient presents with  . Knee Pain    bilateral     Patient has bilateral knee pain she is self-pay she can only do limited treatments.  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  . Chronic pain of both knees Yes  . Tendinitis, de Quervain's     Meds ordered this encounter  Medications  . meloxicam (MOBIC) 7.5 MG tablet    Sig: Take 1 tablet (7.5 mg total) by mouth daily.    Dispense:  30 tablet    Refill:  5

## 2019-07-30 DIAGNOSIS — M180 Bilateral primary osteoarthritis of first carpometacarpal joints: Secondary | ICD-10-CM | POA: Insufficient documentation

## 2019-07-30 DIAGNOSIS — M170 Bilateral primary osteoarthritis of knee: Secondary | ICD-10-CM | POA: Insufficient documentation

## 2019-07-30 DIAGNOSIS — G5600 Carpal tunnel syndrome, unspecified upper limb: Secondary | ICD-10-CM | POA: Insufficient documentation

## 2019-08-06 ENCOUNTER — Ambulatory Visit: Payer: Medicaid Other | Admitting: Orthopedic Surgery

## 2019-08-17 ENCOUNTER — Other Ambulatory Visit: Payer: Self-pay

## 2019-08-17 ENCOUNTER — Encounter (HOSPITAL_COMMUNITY): Payer: Self-pay

## 2019-08-17 DIAGNOSIS — M549 Dorsalgia, unspecified: Secondary | ICD-10-CM | POA: Diagnosis present

## 2019-08-17 DIAGNOSIS — Z87891 Personal history of nicotine dependence: Secondary | ICD-10-CM | POA: Diagnosis not present

## 2019-08-17 DIAGNOSIS — E119 Type 2 diabetes mellitus without complications: Secondary | ICD-10-CM | POA: Diagnosis not present

## 2019-08-17 DIAGNOSIS — Z7984 Long term (current) use of oral hypoglycemic drugs: Secondary | ICD-10-CM | POA: Diagnosis not present

## 2019-08-17 DIAGNOSIS — T502X5A Adverse effect of carbonic-anhydrase inhibitors, benzothiadiazides and other diuretics, initial encounter: Secondary | ICD-10-CM | POA: Diagnosis not present

## 2019-08-17 DIAGNOSIS — R1011 Right upper quadrant pain: Secondary | ICD-10-CM | POA: Insufficient documentation

## 2019-08-17 DIAGNOSIS — R350 Frequency of micturition: Secondary | ICD-10-CM | POA: Insufficient documentation

## 2019-08-17 DIAGNOSIS — I10 Essential (primary) hypertension: Secondary | ICD-10-CM | POA: Diagnosis not present

## 2019-08-17 DIAGNOSIS — E876 Hypokalemia: Secondary | ICD-10-CM | POA: Insufficient documentation

## 2019-08-17 DIAGNOSIS — Z79899 Other long term (current) drug therapy: Secondary | ICD-10-CM | POA: Insufficient documentation

## 2019-08-17 NOTE — ED Triage Notes (Signed)
Pt recently at [facility] and dx with UTI. States she is still having back pain

## 2019-08-18 ENCOUNTER — Emergency Department (HOSPITAL_COMMUNITY): Payer: BLUE CROSS/BLUE SHIELD

## 2019-08-18 ENCOUNTER — Emergency Department (HOSPITAL_COMMUNITY)
Admission: EM | Admit: 2019-08-18 | Discharge: 2019-08-18 | Disposition: A | Discharge status: Home / Self Care | Payer: BLUE CROSS/BLUE SHIELD | Attending: Emergency Medicine | Admitting: Emergency Medicine

## 2019-08-18 DIAGNOSIS — E876 Hypokalemia: Secondary | ICD-10-CM

## 2019-08-18 DIAGNOSIS — R1011 Right upper quadrant pain: Secondary | ICD-10-CM

## 2019-08-18 DIAGNOSIS — T502X5A Adverse effect of carbonic-anhydrase inhibitors, benzothiadiazides and other diuretics, initial encounter: Secondary | ICD-10-CM

## 2019-08-18 HISTORY — DX: Unspecified osteoarthritis, unspecified site: M19.90

## 2019-08-18 LAB — COMPREHENSIVE METABOLIC PANEL
ALT: 26 U/L (ref 0–44)
AST: 16 U/L (ref 15–41)
Albumin: 4.3 g/dL (ref 3.5–5.0)
Alkaline Phosphatase: 67 U/L (ref 38–126)
Anion gap: 9 (ref 5–15)
BUN: 23 mg/dL — ABNORMAL HIGH (ref 6–20)
CO2: 29 mmol/L (ref 22–32)
Calcium: 9 mg/dL (ref 8.9–10.3)
Chloride: 100 mmol/L (ref 98–111)
Creatinine, Ser: 0.81 mg/dL (ref 0.44–1.00)
GFR calc Af Amer: >60 mL/min (ref >60)
GFR calc non Af Amer: >60 mL/min (ref >60)
Glucose, Bld: 115 mg/dL — ABNORMAL HIGH (ref 70–99)
Potassium: 3.2 mmol/L — ABNORMAL LOW (ref 3.5–5.1)
Sodium: 138 mmol/L (ref 135–145)
Total Bilirubin: 0.5 mg/dL (ref 0.3–1.2)
Total Protein: 7.6 g/dL (ref 6.5–8.1)

## 2019-08-18 LAB — CBC WITH DIFFERENTIAL/PLATELET
Abs Immature Granulocytes: 0.02 10*3/uL (ref 0.00–0.07)
Basophils Absolute: 0.1 10*3/uL (ref 0.0–0.1)
Basophils Relative: 1 %
Eosinophils Absolute: 0.3 10*3/uL (ref 0.0–0.5)
Eosinophils Relative: 3 %
HCT: 37.4 % (ref 36.0–46.0)
Hemoglobin: 12 g/dL (ref 12.0–15.0)
Immature Granulocytes: 0 %
Lymphocytes Relative: 44 %
Lymphs Abs: 4.2 10*3/uL — ABNORMAL HIGH (ref 0.7–4.0)
MCH: 27.1 pg (ref 26.0–34.0)
MCHC: 32.1 g/dL (ref 30.0–36.0)
MCV: 84.4 fL (ref 80.0–100.0)
Monocytes Absolute: 0.8 10*3/uL (ref 0.1–1.0)
Monocytes Relative: 8 %
Neutro Abs: 4.2 10*3/uL (ref 1.7–7.7)
Neutrophils Relative %: 44 %
Platelets: 365 10*3/uL (ref 150–400)
RBC: 4.43 MIL/uL (ref 3.87–5.11)
RDW: 14.2 % (ref 11.5–15.5)
WBC: 9.5 10*3/uL (ref 4.0–10.5)
nRBC: 0 % (ref 0.0–0.2)

## 2019-08-18 LAB — URINALYSIS, ROUTINE W REFLEX MICROSCOPIC
Bilirubin Urine: NEGATIVE
Glucose, UA: NEGATIVE mg/dL
Hgb urine dipstick: NEGATIVE
Ketones, ur: NEGATIVE mg/dL
Nitrite: NEGATIVE
Protein, ur: NEGATIVE mg/dL
Specific Gravity, Urine: 1.026 (ref 1.005–1.030)
pH: 6 (ref 5.0–8.0)

## 2019-08-18 LAB — LIPASE, BLOOD: Lipase: 27 U/L (ref 11–51)

## 2019-08-18 MED ORDER — POTASSIUM CHLORIDE CRYS ER 20 MEQ PO TBCR
20.0000 meq | EXTENDED_RELEASE_TABLET | Freq: Two times a day (BID) | ORAL | 0 refills | Status: DC
Start: 2019-08-18 — End: 2020-07-27

## 2019-08-18 MED ORDER — POTASSIUM CHLORIDE CRYS ER 20 MEQ PO TBCR
40.0000 meq | EXTENDED_RELEASE_TABLET | Freq: Once | ORAL | Status: AC
  Administered 2019-08-18: 40 meq via ORAL
  Filled 2019-08-18: qty 2

## 2019-08-18 MED ORDER — KETOROLAC TROMETHAMINE 30 MG/ML IJ SOLN
30.0000 mg | Freq: Once | INTRAMUSCULAR | Status: AC
  Administered 2019-08-18: 30 mg via INTRAVENOUS
  Filled 2019-08-18: qty 1

## 2019-08-18 MED ORDER — HYDROCODONE-ACETAMINOPHEN 5-325 MG PO TABS: 1.0000 | ORAL_TABLET | ORAL | 0 refills | Status: DC | PRN

## 2019-08-18 NOTE — Discharge Instructions (Signed)
Please return in the morning to get an ultrasound of your gallbladder.  You may take ibuprofen or acetaminophen as needed for pain, take hydrocodone-acetaminophen for severe pain.

## 2019-08-18 NOTE — ED Provider Notes (Signed)
[facility] EMERGENCY DEPARTMENT Provider Note   CSN: [id number] Arrival date & time: 08/17/19  1940   History Chief Complaint  Patient presents with  . Recurrent UTI  . Back Pain    Brittany Mack is a 54 y.o. female.  The history is provided by the patient.  Back Pain She has history of hypertension, diabetes and comes in complaining of pain in the right mid abdomen radiating around to the right flank.  Pain is dull in nature but severe and she rates it at 10/10.  It has been present for the last 3weeks.  There is no associated nausea or vomiting and no diarrhea.  She denies any urinary urgency but there has been some urinary frequency.  She denies dysuria.  She denies fever, chills, sweats.  She was seen last week at the emergency department at [facility] and told she had a urinary tract infection and given a prescription for an antibiotic (from her description, I believe it was ciprofloxacin) which did not give her any relief.  She states that she initially took some acetaminophen which did not give her any relief, so she has not been taking any medication for pain.  Past Medical History:  Diagnosis Date  . Arthritis   . Diabetes mellitus without complication ([place])   . Hypertension     There are no problems to display for this patient.   Past Surgical History:  Procedure Laterality Date  . ANKLE SURGERY     right  . CESAREAN SECTION       OB History   No obstetric history on file.     Family History  Problem Relation Age of Onset  . Rheum arthritis Mother   . Hypertension Mother   . Stroke Father   . Heart failure Father   . Hypertension Sister   . Diabetes Brother     Social History   Tobacco Use  . Smoking status: Former Smoker    Quit date: 06/02/1982    Years since quitting: 37.2  . Smokeless tobacco: Never Used  Substance Use Topics  . Alcohol use: No  . Drug use: No    Home Medications Prior to Admission medications   Medication Sig Start  Date End Date Taking? Authorizing Provider  ibuprofen (ADVIL,MOTRIN) 600 MG tablet Take 600 mg by mouth every 6 (six) hours as needed for moderate pain.    [provider]  lisinopril-hydrochlorothiazide (ZESTORETIC) 20-25 MG tablet lisinopril 20 mg-hydrochlorothiazide 25 mg tablet 07/10/18   [provider]  meloxicam (MOBIC) 7.5 MG tablet Take 1 tablet (7.5 mg total) by mouth daily. 12/12/18   Carole Civil, MD  metFORMIN (GLUCOPHAGE) 500 MG tablet metformin 500 mg tablet 06/12/18   [provider]  naproxen sodium (ANAPROX) 220 MG tablet Take 440 mg by mouth daily as needed (pain).    [provider]    Allergies    Patient has no known allergies.  Review of Systems   Review of Systems  Musculoskeletal: Positive for back pain.  All other systems reviewed and are negative.   Physical Exam Updated Vital Signs BP (!) 144/94 (BP Location: Right Arm)   Pulse 69   Temp 98.1 F (36.7 C) (Oral)   Resp 17   Ht 5\' 6"  (1.676 m)   Wt 104.3 kg   SpO2 100%   BMI 37.12 kg/m   Physical Exam Vitals and nursing note reviewed.   54 year old female, resting comfortably and in no acute  distress. Vital signs are significant for mildly elevated blood pressure. Oxygen saturation is 100%, which is normal. Head is normocephalic and atraumatic. PERRLA, EOMI. Oropharynx is clear. Neck is nontender and supple without adenopathy or JVD. Back is nontender in the midline.  There is mild to moderate right CVA tenderness. Lungs are clear without rales, wheezes, or rhonchi. Chest is nontender. Heart has regular rate and rhythm without murmur. Abdomen is soft, flat, with moderate tenderness in the right mid and upper abdomen.  There is no rebound or guarding.  There are no masses or hepatosplenomegaly and peristalsis is normoactive. Extremities have no cyanosis or edema, full range of motion is present. Skin is warm and dry without rash. Neurologic: Mental status is  normal, cranial nerves are intact, there are no motor or sensory deficits.  ED Results / Procedures / Treatments   Labs (all labs ordered are listed, but only abnormal results are displayed) Labs Reviewed  URINALYSIS, ROUTINE W REFLEX MICROSCOPIC - Abnormal; Notable for the following components:      Result Value   Leukocytes,Ua TRACE (*)    Bacteria, UA RARE (*)    All other components within normal limits  CBC WITH DIFFERENTIAL/PLATELET - Abnormal; Notable for the following components:   Lymphs Abs 4.2 (*)    All other components within normal limits  COMPREHENSIVE METABOLIC PANEL - Abnormal; Notable for the following components:   Potassium 3.2 (*)    Glucose, Bld 115 (*)    BUN 23 (*)    All other components within normal limits  LIPASE, BLOOD   Radiology CT Renal Stone Study  Result Date: 08/18/2019 CLINICAL DATA:  Flank pain. Kidney stone suspected. Recently diagnosed with urinary tract infection at outside facility with persistent pain. EXAM: CT ABDOMEN AND PELVIS WITHOUT CONTRAST TECHNIQUE: Multidetector CT imaging of the abdomen and pelvis was performed following the standard protocol without IV contrast. COMPARISON:  None. FINDINGS: Lower chest: Lung bases are clear. Hepatobiliary: Enlarged liver spanning 22 cm cranial caudal. Vague low-density adjacent to the gallbladder fossa in the right lobe of the liver. The adjacent gallbladder wall is indistinct and difficult to delineate from the hepatic parenchyma. Suggestion of heterogeneous intraluminal density in the gallbladder. No common bile duct dilatation. Pancreas: No ductal dilatation or inflammation. There is interdigitation of fat adjacent to the pancreatic head. Spleen: Normal in size without focal abnormality. Adrenals/Urinary Tract: Normal adrenal glands. No hydronephrosis or perinephric edema. No renal or ureteral calculi. No obvious renal lesion. Urinary bladder is completely empty and not well assessed. No bladder stone.  Stomach/Bowel: Stomach is within normal limits. Appendix appears normal. No evidence of bowel wall thickening, distention, or inflammatory changes. Vascular/Lymphatic: Abdominal aorta is normal in caliber. Possible prominent periportal node with indistinct margins, series 2, image 27. Small retroperitoneal nodes are not enlarged by size criteria. Reproductive: Uterus and bilateral adnexa are unremarkable. Other: No free air, free fluid, or intra-abdominal fluid collection. Musculoskeletal: There are no acute or suspicious osseous abnormalities. IMPRESSION: 1. No renal stones or obstructive uropathy. 2. Hepatomegaly. Vague low-density adjacent to the gallbladder fossa in the right lobe of the liver. The adjacent gallbladder wall is indistinct and difficult to delineate from the hepatic low-density. Findings are suspicious for infiltrative or inflammatory process. Gallbladder contents are heterogeneous. Recommend initial evaluation with right upper quadrant ultrasound. Pending ultrasound findings, further evaluation may be needed with abdominal MRI. 3. Questionable prominent periportal node. Electronically Signed   By: Keith Rake M.D.   On: 08/18/2019 02:52  Procedures Procedures  Medications Ordered in ED Medications  potassium chloride SA (KLOR-CON) CR tablet 40 mEq (has no administration in time range)  ketorolac (TORADOL) 30 MG/ML injection 30 mg (30 mg Intravenous Given 08/18/19 0228)    ED Course  I have reviewed the triage vital signs and the nursing notes.  Pertinent labs & imaging results that were available during my care of the patient were reviewed by me and considered in my medical decision making (see chart for details).  MDM Rules/Calculators/A&P Abdominal pain of uncertain cause.  Pattern of pain seems radicular and might suggest herpes zoster, but no rashes seen in spite of symptoms being present for 3 weeks, making herpes zoster very unlikely.  Symptoms are not strongly  suggestive of UTI, but we will check a urine.  Will check screening labs including liver function and lipase to rule out pancreatitis.  Will send for renal stone protocol CT scan to rule out urolithiasis.  We will give a therapeutic trial of ketorolac.  Old records are reviewed, and she has no relevant past visits.  She did note significant improvement with ketorolac.  Labs are unremarkable including urinalysis with the exception of mild hypokalemia which is presumably secondary to the hydrochlorothiazide she takes.  She is given a dose of oral potassium.  She will be brought back in the morning for a right upper quadrant ultrasound.  Final Clinical Impression(s) / ED Diagnoses Final diagnoses:  RUQ abdominal pain  Diuretic-induced hypokalemia    Rx / DC Orders ED Discharge Orders         Ordered    HYDROcodone-acetaminophen (NORCO) 5-325 MG tablet  Every 4 hours PRN     08/18/19 0330    US Abdomen Limited RUQ/Gall Gladder     08/18/19 0330    potassium chloride SA (KLOR-CON) 20 MEQ tablet  2 times daily     08/18/19 [id number]           Delora Fuel, MD [id number] [phone]

## 2019-08-19 MED FILL — Hydrocodone-Acetaminophen Tab 5-325 MG: ORAL | Qty: 6 | Status: AC

## 2019-08-25 ENCOUNTER — Other Ambulatory Visit: Payer: Self-pay

## 2019-08-25 ENCOUNTER — Ambulatory Visit (HOSPITAL_COMMUNITY)
Admission: RE | Admit: 2019-08-25 | Discharge: 2019-08-25 | Disposition: A | Discharge status: Home / Self Care | Payer: BLUE CROSS/BLUE SHIELD | Source: Ambulatory Visit | Attending: Emergency Medicine | Admitting: Emergency Medicine

## 2019-08-25 ENCOUNTER — Observation Stay (HOSPITAL_COMMUNITY)
Admission: EM | Admit: 2019-08-25 | Discharge: 2019-08-26 | Disposition: A | Discharge status: Home / Self Care | Payer: BLUE CROSS/BLUE SHIELD | Attending: General Surgery | Admitting: General Surgery

## 2019-08-25 ENCOUNTER — Encounter (HOSPITAL_COMMUNITY): Payer: Self-pay | Admitting: Emergency Medicine

## 2019-08-25 ENCOUNTER — Observation Stay (HOSPITAL_COMMUNITY): Payer: BLUE CROSS/BLUE SHIELD

## 2019-08-25 DIAGNOSIS — I10 Essential (primary) hypertension: Secondary | ICD-10-CM | POA: Insufficient documentation

## 2019-08-25 DIAGNOSIS — Z79899 Other long term (current) drug therapy: Secondary | ICD-10-CM | POA: Insufficient documentation

## 2019-08-25 DIAGNOSIS — Z20822 Contact with and (suspected) exposure to covid-19: Secondary | ICD-10-CM | POA: Insufficient documentation

## 2019-08-25 DIAGNOSIS — R1011 Right upper quadrant pain: Secondary | ICD-10-CM

## 2019-08-25 DIAGNOSIS — E119 Type 2 diabetes mellitus without complications: Secondary | ICD-10-CM | POA: Diagnosis not present

## 2019-08-25 DIAGNOSIS — K828 Other specified diseases of gallbladder: Secondary | ICD-10-CM | POA: Diagnosis not present

## 2019-08-25 DIAGNOSIS — Z87891 Personal history of nicotine dependence: Secondary | ICD-10-CM | POA: Insufficient documentation

## 2019-08-25 DIAGNOSIS — K819 Cholecystitis, unspecified: Secondary | ICD-10-CM | POA: Diagnosis not present

## 2019-08-25 DIAGNOSIS — C23 Malignant neoplasm of gallbladder: Secondary | ICD-10-CM | POA: Diagnosis not present

## 2019-08-25 DIAGNOSIS — Z7984 Long term (current) use of oral hypoglycemic drugs: Secondary | ICD-10-CM | POA: Diagnosis not present

## 2019-08-25 LAB — COMPREHENSIVE METABOLIC PANEL
ALT: 621 U/L — ABNORMAL HIGH (ref 0–44)
AST: 397 U/L — ABNORMAL HIGH (ref 15–41)
Albumin: 4.1 g/dL (ref 3.5–5.0)
Alkaline Phosphatase: 154 U/L — ABNORMAL HIGH (ref 38–126)
Anion gap: 8 (ref 5–15)
BUN: 10 mg/dL (ref 6–20)
CO2: 28 mmol/L (ref 22–32)
Calcium: 9.4 mg/dL (ref 8.9–10.3)
Chloride: 105 mmol/L (ref 98–111)
Creatinine, Ser: 1.05 mg/dL — ABNORMAL HIGH (ref 0.44–1.00)
GFR calc Af Amer: >60 mL/min (ref >60)
GFR calc non Af Amer: >60 mL/min (ref >60)
Glucose, Bld: 108 mg/dL — ABNORMAL HIGH (ref 70–99)
Potassium: 4.4 mmol/L (ref 3.5–5.1)
Sodium: 141 mmol/L (ref 135–145)
Total Bilirubin: 1.3 mg/dL — ABNORMAL HIGH (ref 0.3–1.2)
Total Protein: 7.6 g/dL (ref 6.5–8.1)

## 2019-08-25 LAB — CBC
HCT: 37.4 % (ref 36.0–46.0)
Hemoglobin: 12 g/dL (ref 12.0–15.0)
MCH: 27.4 pg (ref 26.0–34.0)
MCHC: 32.1 g/dL (ref 30.0–36.0)
MCV: 85.4 fL (ref 80.0–100.0)
Platelets: 352 10*3/uL (ref 150–400)
RBC: 4.38 MIL/uL (ref 3.87–5.11)
RDW: 14.5 % (ref 11.5–15.5)
WBC: 5.7 10*3/uL (ref 4.0–10.5)
nRBC: 0 % (ref 0.0–0.2)

## 2019-08-25 LAB — URINALYSIS, ROUTINE W REFLEX MICROSCOPIC
Bilirubin Urine: NEGATIVE
Glucose, UA: NEGATIVE mg/dL
Hgb urine dipstick: NEGATIVE
Ketones, ur: NEGATIVE mg/dL
Leukocytes,Ua: NEGATIVE
Nitrite: NEGATIVE
Protein, ur: NEGATIVE mg/dL
Specific Gravity, Urine: 1.016 (ref 1.005–1.030)
pH: 6 (ref 5.0–8.0)

## 2019-08-25 LAB — SARS CORONAVIRUS 2 BY RT PCR (HOSPITAL ORDER, PERFORMED IN ~~LOC~~ HOSPITAL LAB): SARS Coronavirus 2: NEGATIVE

## 2019-08-25 LAB — LIPASE, BLOOD: Lipase: 22 U/L (ref 11–51)

## 2019-08-25 LAB — HEMOGLOBIN A1C
Hgb A1c MFr Bld: 6.1 % — ABNORMAL HIGH (ref 4.8–5.6)
Mean Plasma Glucose: 128.37 mg/dL

## 2019-08-25 LAB — HIV ANTIBODY (ROUTINE TESTING W REFLEX): HIV Screen 4th Generation wRfx: NONREACTIVE

## 2019-08-25 MED ORDER — SIMETHICONE 80 MG PO CHEW: 40.0000 mg | CHEWABLE_TABLET | Freq: Four times a day (QID) | ORAL | Status: DC | PRN

## 2019-08-25 MED ORDER — METFORMIN HCL 500 MG PO TABS
500.0000 mg | ORAL_TABLET | Freq: Every day | ORAL | Status: DC
  Administered 2019-08-26: 500 mg via ORAL
  Filled 2019-08-25: qty 1

## 2019-08-25 MED ORDER — LISINOPRIL 10 MG PO TABS
20.0000 mg | ORAL_TABLET | Freq: Every day | ORAL | Status: DC
  Administered 2019-08-26: 20 mg via ORAL
  Filled 2019-08-25: qty 2

## 2019-08-25 MED ORDER — ONDANSETRON 4 MG PO TBDP: 4.0000 mg | ORAL_TABLET | Freq: Four times a day (QID) | ORAL | Status: DC | PRN

## 2019-08-25 MED ORDER — ONDANSETRON HCL 4 MG/2ML IJ SOLN
4.0000 mg | Freq: Once | INTRAMUSCULAR | Status: AC
  Administered 2019-08-25: 4 mg via INTRAVENOUS
  Filled 2019-08-25: qty 2

## 2019-08-25 MED ORDER — DIPHENHYDRAMINE HCL 12.5 MG/5ML PO ELIX
12.5000 mg | ORAL_SOLUTION | Freq: Four times a day (QID) | ORAL | Status: DC | PRN
  Administered 2019-08-25: 12.5 mg via ORAL
  Filled 2019-08-25: qty 5

## 2019-08-25 MED ORDER — ONDANSETRON HCL 4 MG/2ML IJ SOLN: 4.0000 mg | Freq: Four times a day (QID) | INTRAMUSCULAR | Status: DC | PRN

## 2019-08-25 MED ORDER — GABAPENTIN 300 MG PO CAPS
300.0000 mg | ORAL_CAPSULE | Freq: Every day | ORAL | Status: DC
  Administered 2019-08-25: 300 mg via ORAL
  Filled 2019-08-25: qty 1

## 2019-08-25 MED ORDER — LISINOPRIL-HYDROCHLOROTHIAZIDE 20-25 MG PO TABS: 1.0000 | ORAL_TABLET | Freq: Every day | ORAL | Status: DC

## 2019-08-25 MED ORDER — INSULIN ASPART 100 UNIT/ML ~~LOC~~ SOLN: 0.0000 [IU] | Freq: Three times a day (TID) | SUBCUTANEOUS | Status: DC

## 2019-08-25 MED ORDER — MORPHINE SULFATE (PF) 2 MG/ML IV SOLN: 2.0000 mg | INTRAVENOUS | Status: DC | PRN

## 2019-08-25 MED ORDER — DIPHENHYDRAMINE HCL 50 MG/ML IJ SOLN: 12.5000 mg | Freq: Four times a day (QID) | INTRAMUSCULAR | Status: DC | PRN

## 2019-08-25 MED ORDER — MORPHINE SULFATE (PF) 4 MG/ML IV SOLN
4.0000 mg | Freq: Once | INTRAVENOUS | Status: AC
  Administered 2019-08-25: 4 mg via INTRAVENOUS
  Filled 2019-08-25: qty 1

## 2019-08-25 MED ORDER — LACTATED RINGERS IV SOLN: INTRAVENOUS | Status: DC

## 2019-08-25 MED ORDER — PIPERACILLIN-TAZOBACTAM 3.375 G IVPB
3.3750 g | Freq: Three times a day (TID) | INTRAVENOUS | Status: DC
  Administered 2019-08-25 – 2019-08-26 (×2): 3.375 g via INTRAVENOUS
  Filled 2019-08-25 (×2): qty 50

## 2019-08-25 MED ORDER — ENOXAPARIN SODIUM 40 MG/0.4ML ~~LOC~~ SOLN
40.0000 mg | SUBCUTANEOUS | Status: DC
  Administered 2019-08-25: 40 mg via SUBCUTANEOUS
  Filled 2019-08-25: qty 0.4

## 2019-08-25 MED ORDER — PIPERACILLIN-TAZOBACTAM 3.375 G IVPB 30 MIN
3.3750 g | Freq: Once | INTRAVENOUS | Status: AC
  Administered 2019-08-25: 3.375 g via INTRAVENOUS
  Filled 2019-08-25: qty 50

## 2019-08-25 MED ORDER — ZOLPIDEM TARTRATE 5 MG PO TABS: 5.0000 mg | ORAL_TABLET | Freq: Every evening | ORAL | Status: DC | PRN

## 2019-08-25 MED ORDER — HYDROCHLOROTHIAZIDE 25 MG PO TABS
25.0000 mg | ORAL_TABLET | Freq: Every day | ORAL | Status: DC
  Administered 2019-08-26: 25 mg via ORAL
  Filled 2019-08-25: qty 1

## 2019-08-25 MED ORDER — SODIUM CHLORIDE 0.9 % IV BOLUS
1000.0000 mL | Freq: Once | INTRAVENOUS | Status: AC
  Administered 2019-08-25: 1000 mL via INTRAVENOUS

## 2019-08-25 MED ORDER — METHOCARBAMOL 500 MG PO TABS: 500.0000 mg | ORAL_TABLET | Freq: Four times a day (QID) | ORAL | Status: DC | PRN

## 2019-08-25 MED ORDER — GABAPENTIN 300 MG PO CAPS: 300.0000 mg | ORAL_CAPSULE | Freq: Every day | ORAL | Status: DC

## 2019-08-25 MED ORDER — GADOBUTROL 1 MMOL/ML IV SOLN
10.0000 mL | Freq: Once | INTRAVENOUS | Status: AC | PRN
  Administered 2019-08-25: 10 mL via INTRAVENOUS

## 2019-08-25 MED ORDER — OXYCODONE HCL 5 MG PO TABS
5.0000 mg | ORAL_TABLET | ORAL | Status: DC | PRN
  Administered 2019-08-25 – 2019-08-26 (×2): 10 mg via ORAL
  Administered 2019-08-26: 5 mg via ORAL
  Filled 2019-08-25 (×2): qty 2
  Filled 2019-08-25: qty 1

## 2019-08-25 MED ORDER — METOPROLOL TARTRATE 5 MG/5ML IV SOLN: 5.0000 mg | Freq: Four times a day (QID) | INTRAVENOUS | Status: DC | PRN

## 2019-08-25 NOTE — Progress Notes (Signed)
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 No leukocytosis, bilirubin going up from last week. No jaundice.   Discussed case with Dr. Jyl [facility] says he can see her Friday at 2pm.   I have called and notified his office. Images will be pushed to [facility]. Will also make sure she has a CD.   Will  Give her a diet now. Will get her pain under control, repeat labs in AM. Plan for her to see Dr. Crisoforo Oxford Friday.  Curlene Labrum, MD [facility] [address] [place], [place] [phone] (office)

## 2019-08-25 NOTE — H&P (Signed)
[facility] History and Physical  Reason for Referral: Cholecystitis?  Referring Physician:  Arlean Hopping, PA (ED)   Chief Complaint    Abdominal Pain      Brittany Mack is a 54 y.o. female.  HPI: Brittany Mack is a 54 yo who has been to the ED in the past week for RUQ pain and associated nausea and vomiting. The pain was in the right upper quadrant and radiated around to her back. This has been going on for over 3 weeks now. She said it was dull but severe at times, and has continued to persist this past week. She came in today for an US of the RUQ to follow up her CT scan.  She also had been to [facility] last week with a possible UTI per report.     On her [place] today she had a positive Murphy sign and complained of 10/10 pain. She denied any fevers or chills. She says she had no weight loss.  Her CT demonstrated concern for a mass adjacent to the gallbladder and the [place] also demonstrates concern for this on today's read.   She reports some chest tightness with anxiety but no prior history of any cardiac events. She says she gets winded going up stairs.   Past Medical History:  Diagnosis Date  . Arthritis   . Diabetes mellitus without complication ([place])   . Hypertension     Past Surgical History:  Procedure Laterality Date  . ANKLE SURGERY     right  . CESAREAN SECTION      Family History  Problem Relation Age of Onset  . Rheum arthritis Mother   . Hypertension Mother   . Stroke Father   . Heart failure Father   . Hypertension Sister   . Diabetes Brother     Social History   Tobacco Use  . Smoking status: Former Smoker    Quit date: 06/02/1982    Years since quitting: 37.2  . Smokeless tobacco: Never Used  Substance Use Topics  . Alcohol use: No  . Drug use: No    Medications: I have reviewed the patient's current medications. Current Facility-Administered Medications  Medication Dose Route Frequency Provider Last Rate Last Admin  .  diphenhydrAMINE (BENADRYL) 12.5 MG/5ML elixir 12.5 mg  12.5 mg Oral Q6H PRN Virl Cagey, MD       Or  . diphenhydrAMINE (BENADRYL) injection 12.5 mg  12.5 mg Intravenous Q6H PRN Virl Cagey, MD      . enoxaparin (LOVENOX) injection 40 mg  40 mg Subcutaneous Q24H Virl Cagey, MD      . gabapentin (NEURONTIN) capsule 300 mg  300 mg Oral Daily Virl Cagey, MD      . lisinopril (ZESTRIL) tablet 20 mg  20 mg Oral Daily Virl Cagey, MD       And  . hydrochlorothiazide (HYDRODIURIL) tablet 25 mg  25 mg Oral Daily Virl Cagey, MD      . insulin aspart (novoLOG) injection 0-15 Units  0-15 Units Subcutaneous TID WC Virl Cagey, MD      . lactated ringers infusion   Intravenous Continuous Virl Cagey, MD      . methocarbamol (ROBAXIN) tablet 500 mg  500 mg Oral Q6H PRN Virl Cagey, MD      . metoprolol tartrate (LOPRESSOR) injection 5 mg  5 mg Intravenous Q6H PRN Virl Cagey, MD      . morphine 2  MG/ML injection 2 mg  2 mg Intravenous Q3H PRN Virl Cagey, MD      . ondansetron (ZOFRAN-ODT) disintegrating tablet 4 mg  4 mg Oral Q6H PRN Virl Cagey, MD       Or  . ondansetron [facility]) injection 4 mg  4 mg Intravenous Q6H PRN Virl Cagey, MD      . oxyCODONE (Oxy IR/ROXICODONE) immediate release tablet 5-10 mg  5-10 mg Oral Q4H PRN Virl Cagey, MD      . piperacillin-tazobactam (ZOSYN) IVPB 3.375 g  3.375 g Intravenous Q8H Virl Cagey, MD      . simethicone [facility]) chewable tablet 40 mg  40 mg Oral Q6H PRN Virl Cagey, MD      . zolpidem (AMBIEN) tablet 5 mg  5 mg Oral QHS PRN Virl Cagey, MD       Current Outpatient Medications  Medication Sig Dispense Refill Last Dose  . gabapentin (NEURONTIN) 300 MG capsule Take 300 mg by mouth daily.   Past Week at Unknown time  . HYDROcodone-acetaminophen (NORCO) 5-325 MG tablet Take 1 tablet by mouth every 4 (four) hours as needed for moderate  pain. 6 tablet 0 Past Week at Unknown time  . ibuprofen (ADVIL,MOTRIN) 600 MG tablet Take 600 mg by mouth every 6 (six) hours as needed for moderate pain.     Marland Kitchen lisinopril-hydrochlorothiazide (ZESTORETIC) 20-25 MG tablet lisinopril 20 mg-hydrochlorothiazide 25 mg tablet   Past Week at Unknown time  . meloxicam (MOBIC) 7.5 MG tablet Take 1 tablet (7.5 mg total) by mouth daily. (Patient taking differently: Take 15 mg by mouth daily. ) 30 tablet 5 Past Week at Unknown time  . metFORMIN (GLUCOPHAGE) 500 MG tablet metformin 500 mg tablet   Past Week at Unknown time  . Multiple Vitamins-Minerals (CENTRUM ADULTS) TABS Take 1 tablet by mouth daily.   Past Week at Unknown time  . naproxen sodium (ANAPROX) 220 MG tablet Take 440 mg by mouth daily as needed (pain).     . simvastatin (ZOCOR) 40 MG tablet Take 1 tablet by mouth every evening.   Past Week at Unknown time  . potassium chloride SA (KLOR-CON) 20 MEQ tablet Take 1 tablet (20 mEq total) by mouth 2 (two) times daily. (Patient not taking: Reported on 08/25/2019) 30 tablet 0 Not Taking at Unknown time   No Known Allergies   ROS:  A comprehensive review of systems was negative except for: Gastrointestinal: positive for abdominal pain, nausea and vomiting  Blood pressure (!) 142/95, pulse (!) 53, temperature 98.7 F (37.1 C), temperature source Oral, resp. rate 20, height 5\' 6"  (1.676 m), weight 104.3 kg, SpO2 100 %. Physical Exam Vitals reviewed.  Constitutional:      Appearance: She is well-developed.  HENT:     Head: Normocephalic and atraumatic.  Eyes:     Extraocular Movements: Extraocular movements intact.  Cardiovascular:     Rate and Rhythm: Normal rate.  Pulmonary:     Effort: Pulmonary effort is normal.  Abdominal:     General: There is distension.     Palpations: Abdomen is soft.     Tenderness: There is abdominal tenderness in the right upper quadrant and epigastric area.  Musculoskeletal:     Comments: Moves all extremities   Skin:    General: Skin is warm and dry.  Neurological:     General: No focal deficit present.     Mental Status: She is alert and oriented to person,  place, and time.  Psychiatric:        Mood and Affect: Mood normal.        Behavior: Behavior normal.     Results: Results for orders placed or performed during the hospital encounter of 08/25/19 (from the past 48 hour(s))  Urinalysis, Routine w reflex microscopic     Status: Abnormal   Collection Time: 08/25/19 11:33 AM  Result Value Ref Range   Color, Urine AMBER (A) YELLOW    Comment: BIOCHEMICALS MAY BE AFFECTED BY COLOR   APPearance CLEAR CLEAR   Specific Gravity, Urine 1.016 1.005 - 1.030   pH 6.0 5.0 - 8.0   Glucose, UA NEGATIVE NEGATIVE mg/dL   Hgb urine dipstick NEGATIVE NEGATIVE   Bilirubin Urine NEGATIVE NEGATIVE   Ketones, ur NEGATIVE NEGATIVE mg/dL   Protein, ur NEGATIVE NEGATIVE mg/dL   Nitrite NEGATIVE NEGATIVE   Leukocytes,Ua NEGATIVE NEGATIVE    Comment: Performed at [facility], [address], [place], [place] [address]  Lipase, blood     Status: None   Collection Time: 08/25/19 11:53 AM  Result Value Ref Range   Lipase 22 11 - 51 U/L    Comment: Performed at [facility], [address], [place], [place] [address]  Comprehensive metabolic panel     Status: Abnormal   Collection Time: 08/25/19 11:53 AM  Result Value Ref Range   Sodium 141 135 - 145 mmol/L   Potassium 4.4 3.5 - 5.1 mmol/L   Chloride 105 98 - 111 mmol/L   CO2 28 22 - 32 mmol/L   Glucose, Bld 108 (H) 70 - 99 mg/dL    Comment: Glucose reference range applies only to samples taken after fasting for at least 8 hours.   BUN 10 6 - 20 mg/dL   Creatinine, Ser 1.05 (H) 0.44 - 1.00 mg/dL   Calcium 9.4 8.9 - 10.3 mg/dL   Total Protein 7.6 6.5 - 8.1 g/dL   Albumin 4.1 3.5 - 5.0 g/dL   AST 397 (H) 15 - 41 U/L   ALT 621 (H) 0 - 44 U/L   Alkaline Phosphatase 154 (H) 38 - 126 U/L   Total Bilirubin 1.3 (H) 0.3 - 1.2 mg/dL   GFR calc non Af Amer >60  >60 mL/min   GFR calc Af Amer >60 >60 mL/min   Anion gap 8 5 - 15    Comment: Performed at [facility], [address] [place], [place] [address]  CBC     Status: None   Collection Time: 08/25/19 11:53 AM  Result Value Ref Range   WBC 5.7 4.0 - 10.5 K/uL   RBC 4.38 3.87 - 5.11 MIL/uL   Hemoglobin 12.0 12.0 - 15.0 g/dL   HCT 37.4 36.0 - 46.0 %   MCV 85.4 80.0 - 100.0 fL   MCH 27.4 26.0 - 34.0 pg   MCHC 32.1 30.0 - 36.0 g/dL   RDW 14.5 11.5 - 15.5 %   Platelets 352 150 - 400 K/uL   nRBC 0.0 0.0 - 0.2 %    Comment: Performed at [facility], [address] [place], [place] [address]  SARS Coronavirus 2 by RT PCR (hospital order, performed in [place] hospital lab) Nasopharyngeal Nasopharyngeal Swab     Status: None   Collection Time: 08/25/19  1:48 PM   Specimen: Nasopharyngeal Swab  Result Value Ref Range   SARS Coronavirus 2 NEGATIVE NEGATIVE    Comment: (NOTE) SARS-CoV-2 target nucleic acids are NOT DETECTED. The SARS-CoV-2 RNA  is generally detectable in upper and lower respiratory specimens during the acute phase of infection. The lowest concentration of SARS-CoV-2 viral copies this assay can detect is 250 copies / mL. A negative result does not preclude SARS-CoV-2 infection and should not be used as the sole basis for treatment or other patient management decisions.  A negative result may occur with improper specimen collection / handling, submission of specimen other than nasopharyngeal swab, presence of viral mutation(s) within the areas targeted by this assay, and inadequate number of viral copies (<250 copies / mL). A negative result must be combined with clinical observations, patient history, and epidemiological information. Fact Sheet for Patients:   StrictlyIdeas.no Fact Sheet for Healthcare Providers: BankingDealers.co.za This test is not yet approved or cleared  by the [place] FDA and has been authorized for  detection and/or diagnosis of SARS-CoV-2 by FDA under an Emergency Use Authorization (EUA).  This EUA will remain in effect (meaning this test can be used) for the duration of the COVID-19 declaration under Section 564(b)(1) of the Act, 21 U.S.C. section 360bbb-3(b)(1), unless the authorization is terminated or revoked sooner. Performed at [facility], [address], [place], [place] [address]    Personally reviewed [place] and  CT renal protocol and reviewed with Radiology, Dr. Thornton Papas- jagged edge of gallbladder and ill defined mass US ABDOMEN LIMITED RUQ  Result Date: 08/25/2019 CLINICAL DATA:  RIGHT upper quadrant pain for 3 weeks EXAM: ULTRASOUND ABDOMEN LIMITED RIGHT UPPER QUADRANT COMPARISON:  CT 08/18/2018 FINDINGS: Gallbladder: Large gallstone within the lumen of the gallbladder measuring 2 cm. There is dense posterior shadowing which does limit evaluation the gallbladder. Adjacent to the gallbladder there is hypoechogenic tissue which corresponds to soft tissue on comparison CT. This measures approximately 2.5 x 3.5 cm. There is a positive sonographic Murphy's sign Common bile duct: Diameter: Normal at 3 mm Liver: Liver is increased in echogenicity. Gallbladder fossa lesion versus of parenchymal lesion as described above. Portal vein is patent on color Doppler imaging with normal direction of blood flow towards the liver. Other: None. IMPRESSION: 1. Large gallstone with sonographic Murphy's sign is concerning for cholecystitis. 2. Potential soft tissue mass within the gallbladder fossa or adjacent liver parenchyma. Lesion incompletely characterized on comparison CT or current ultrasound. Consider MRI with and without contrast for further evaluation. Electronically Signed   By: Suzy Bouchard M.D.   On: 08/25/2019 10:53     Assessment & Plan:  Brittany Mack is a 54 y.o. female with what appears to be a potential mass associated with the gallbladder and the liver parenchyma that needs further  workup. There was also a large stone and concern for cholecystitis.   -MRI Abdomen with and without contrast ordered to further work up, discussed possibility of cancer or mass or hemangioma, and possibility that she would need to get surgery elsewhere -Discussed IV zosyn for cholecystitis for now  -PRN for pain -EKG is sinus rhythm -Clears until midnight -after MRI  -SCDs, lovenox  -CEA and CA 19-9 sent   All questions were answered to the satisfaction of the patient.   Virl Cagey 08/25/2019, 3:17 PM

## 2019-08-25 NOTE — Progress Notes (Signed)
[facility]  Notified patient and sons of MRI results and plan for seeing Dr. Crisoforo Oxford.  Curlene Labrum, MD [facility] [address] [place], [place] [address] [phone] (office)

## 2019-08-25 NOTE — ED Triage Notes (Signed)
Seen in Radiology this am, read by Dr Melina Copa.  Pt c/o RUQ pain 10/10.

## 2019-08-25 NOTE — Progress Notes (Signed)
Pharmacy Antibiotic Note  Brittany Mack is a 54 y.o. female admitted on 08/25/2019 with acute cholecystitis.  Pharmacy has been consulted for Zosyn dosing.  Plan: Zosyn 3.375g IV q8h (4 hour infusion).  Monitor labs, c/s, and patient improvement.  Height: 5\' 6"  (167.6 cm) Weight: 104.3 kg (230 lb) IBW/kg (Calculated) : 59.3  Temp (24hrs), Avg:98.7 F (37.1 C), Min:98.7 F (37.1 C), Max:98.7 F (37.1 C)  Recent Labs  Lab 08/25/19 1153  WBC 5.7  CREATININE 1.05*    Estimated Creatinine Clearance: 74.7 mL/min (A) (by C-G formula based on SCr of 1.05 mg/dL (H)).    No Known Allergies  Antimicrobials this admission: Zosyn 6/1 >>     Dose adjustments this admission: N/A  Microbiology results: None pending  Thank you for allowing pharmacy to be a part of this patient's care.  Margot Ables, PharmD Clinical Pharmacist 08/25/2019 2:51 PM

## 2019-08-25 NOTE — ED Provider Notes (Signed)
[facility] EMERGENCY DEPARTMENT Provider Note   CSN: [id number] Arrival date & time: 08/25/19  1111     History Chief Complaint  Patient presents with  . Abdominal Pain    RUQ    Brittany Mack is a 54 y.o. female.  HPI      Brittany Mack is a 54 y.o. female, with a history of DM and HTN, presenting to the ED with abdominal pain for the last 3 weeks.  Patient states she has been experiencing right upper quadrant abdominal pain, sharp, waxing and waning, worsening since onset, now 10/10, radiating to the right lower back as well as the epigastric region. Accompanied by nausea and chills. Vomiting beginning 2 days ago.    Last food or drink was yesterday afternoon.  She has not taken her medications for about the last 2 weeks due to nausea.  Patient was seen here in the ED May 25.  Order was placed for right upper quadrant ultrasound.  When patient called to have this performed, she was told they would have to postpone it since she had already eaten that day.  She underwent this ultrasound today and was told to come to the ED due to the results.   Denies fever, hematochezia/melena, diarrhea, constipation, chest pain, shortness of breath, syncope, urinary symptoms, or any other complaints.  Past Medical History:  Diagnosis Date  . Arthritis   . Diabetes mellitus without complication ([place])   . Hypertension     Patient Active Problem List   Diagnosis Date Noted  . Cholecystitis 08/25/2019  . Gallbladder mass     Past Surgical History:  Procedure Laterality Date  . ANKLE SURGERY     right  . CESAREAN SECTION       OB History   No obstetric history on file.     Family History  Problem Relation Age of Onset  . Rheum arthritis Mother   . Hypertension Mother   . Stroke Father   . Heart failure Father   . Hypertension Sister   . Diabetes Brother     Social History   Tobacco Use  . Smoking status: Former Smoker    Quit date: 06/02/1982    Years since quitting:  37.2  . Smokeless tobacco: Never Used  Substance Use Topics  . Alcohol use: No  . Drug use: No    Home Medications Prior to Admission medications   Medication Sig Start Date End Date Taking? Authorizing Provider  gabapentin (NEURONTIN) 300 MG capsule Take 300 mg by mouth daily. 05/28/19  Yes [provider]  HYDROcodone-acetaminophen (NORCO) 5-325 MG tablet Take 1 tablet by mouth every 4 (four) hours as needed for moderate pain. [id number]  Yes Delora Fuel, MD  ibuprofen (ADVIL,MOTRIN) 600 MG tablet Take 600 mg by mouth every 6 (six) hours as needed for moderate pain.   Yes [provider]  lisinopril-hydrochlorothiazide (ZESTORETIC) 20-25 MG tablet lisinopril 20 mg-hydrochlorothiazide 25 mg tablet 07/10/18  Yes [provider]  meloxicam (MOBIC) 7.5 MG tablet Take 1 tablet (7.5 mg total) by mouth daily. Patient taking differently: Take 15 mg by mouth daily.  12/12/18  Yes Carole Civil, MD  metFORMIN (GLUCOPHAGE) 500 MG tablet metformin 500 mg tablet 06/12/18  Yes [provider]  Multiple Vitamins-Minerals (CENTRUM ADULTS) TABS Take 1 tablet by mouth daily.   Yes [provider]  naproxen sodium (ANAPROX) 220 MG tablet Take 440 mg by mouth daily as needed (pain).   Yes [provider]  simvastatin (ZOCOR) 40 MG tablet Take 1 tablet by mouth every evening. 06/08/19  Yes [provider]  potassium chloride SA (KLOR-CON) 20 MEQ tablet Take 1 tablet (20 mEq total) by mouth 2 (two) times daily. Patient not taking: Reported on 0000000 [id number]   Delora Fuel, MD    Allergies    Patient has no known allergies.  Review of Systems   Review of Systems  Constitutional: Positive for chills. Negative for fever.  Respiratory: Negative for shortness of breath.   Cardiovascular: Negative for chest pain.  Gastrointestinal: Positive for abdominal pain, nausea and vomiting. Negative for blood in stool, constipation and diarrhea.    Genitourinary: Negative for dysuria and frequency.  Neurological: Negative for dizziness, syncope and weakness.  All other systems reviewed and are negative.   Physical Exam Updated Vital Signs BP (!) 178/92 (BP Location: Right Arm)   Pulse 65   Temp 98.7 F (37.1 C) (Oral)   Resp 16   Ht 5\' 6"  (1.676 m)   Wt 104.3 kg   SpO2 99%   BMI 37.12 kg/m   Physical Exam Vitals and nursing note reviewed.  Constitutional:      General: She is not in acute distress.    Appearance: She is well-developed. She is not diaphoretic.  HENT:     Head: Normocephalic and atraumatic.     Mouth/Throat:     Mouth: Mucous membranes are moist.     Pharynx: Oropharynx is clear.  Eyes:     Conjunctiva/sclera: Conjunctivae normal.  Cardiovascular:     Rate and Rhythm: Normal rate and regular rhythm.     Pulses: Normal pulses.          Radial pulses are 2+ on the right side and 2+ on the left side.       Posterior tibial pulses are 2+ on the right side and 2+ on the left side.     Heart sounds: Normal heart sounds.     Comments: Tactile temperature in the extremities appropriate and equal bilaterally. Pulmonary:     Effort: Pulmonary effort is normal. No respiratory distress.     Breath sounds: Normal breath sounds.  Abdominal:     General: Bowel sounds are normal.     Palpations: Abdomen is soft.     Tenderness: There is abdominal tenderness in the right upper quadrant. There is no guarding. Positive signs include Murphy's sign.    Musculoskeletal:     Cervical back: Neck supple.     Right lower leg: No edema.     Left lower leg: No edema.  Lymphadenopathy:     Cervical: No cervical adenopathy.  Skin:    General: Skin is warm and dry.  Neurological:     Mental Status: She is alert.  Psychiatric:        Mood and Affect: Mood and affect normal.        Speech: Speech normal.        Behavior: Behavior normal.     ED Results / Procedures / Treatments   Labs (all labs ordered are  listed, but only abnormal results are displayed) Labs Reviewed  COMPREHENSIVE METABOLIC PANEL - Abnormal; Notable for the following components:      Result Value   Glucose, Bld 108 (*)    Creatinine, Ser 1.05 (*)    AST 397 (*)    ALT 621 (*)    Alkaline Phosphatase 154 (*)    Total Bilirubin 1.3 (*)    All other  components within normal limits  URINALYSIS, ROUTINE W REFLEX MICROSCOPIC - Abnormal; Notable for the following components:   Color, Urine AMBER (*)    All other components within normal limits  SARS CORONAVIRUS 2 BY RT PCR (HOSPITAL ORDER, [place] [facility])  LIPASE, BLOOD  CBC  HIV ANTIBODY (ROUTINE TESTING W REFLEX)  CEA  CANCER ANTIGEN 19-9  HEMOGLOBIN A1C    ALT  Date Value Ref Range Status  08/25/2019 621 (H) 0 - 44 U/L Final  08/18/2019 26 0 - 44 U/L Final  06/02/2015 14 14 - 54 U/L Final    AST  Date Value Ref Range Status  08/25/2019 397 (H) 15 - 41 U/L Final  08/18/2019 16 15 - 41 U/L Final  06/02/2015 17 15 - 41 U/L Final    EKG EKG Interpretation  Date/Time:  Tuesday August 25 2019 14:10:27 EDT Ventricular Rate:  59 PR Interval:    QRS Duration: 83 QT Interval:  438 QTC Calculation: 434 R Axis:   44 Text Interpretation: Sinus rhythm No old tracing to compare Confirmed by Aletta Edouard [phone]) on 08/25/2019 2:32:19 PM   Radiology US ABDOMEN LIMITED RUQ  Result Date: 08/25/2019 CLINICAL DATA:  RIGHT upper quadrant pain for 3 weeks EXAM: ULTRASOUND ABDOMEN LIMITED RIGHT UPPER QUADRANT COMPARISON:  CT 08/18/2018 FINDINGS: Gallbladder: Large gallstone within the lumen of the gallbladder measuring 2 cm. There is dense posterior shadowing which does limit evaluation the gallbladder. Adjacent to the gallbladder there is hypoechogenic tissue which corresponds to soft tissue on comparison CT. This measures approximately 2.5 x 3.5 cm. There is a positive sonographic Murphy's sign Common bile duct: Diameter: Normal at 3 mm Liver: Liver is  increased in echogenicity. Gallbladder fossa lesion versus of parenchymal lesion as described above. Portal vein is patent on color Doppler imaging with normal direction of blood flow towards the liver. Other: None. IMPRESSION: 1. Large gallstone with sonographic Murphy's sign is concerning for cholecystitis. 2. Potential soft tissue mass within the gallbladder fossa or adjacent liver parenchyma. Lesion incompletely characterized on comparison CT or current ultrasound. Consider MRI with and without contrast for further evaluation. Electronically Signed   By: Suzy Bouchard M.D.   On: 08/25/2019 10:53   CT Renal Stone Study  Result Date: 08/18/2019 CLINICAL DATA:  Flank pain. Kidney stone suspected. Recently diagnosed with urinary tract infection at outside facility with persistent pain. EXAM: CT ABDOMEN AND PELVIS WITHOUT CONTRAST TECHNIQUE: Multidetector CT imaging of the abdomen and pelvis was performed following the standard protocol without IV contrast. COMPARISON:  None. FINDINGS: Lower chest: Lung bases are clear. Hepatobiliary: Enlarged liver spanning 22 cm cranial caudal. Vague low-density adjacent to the gallbladder fossa in the right lobe of the liver. The adjacent gallbladder wall is indistinct and difficult to delineate from the hepatic parenchyma. Suggestion of heterogeneous intraluminal density in the gallbladder. No common bile duct dilatation. Pancreas: No ductal dilatation or inflammation. There is interdigitation of fat adjacent to the pancreatic head. Spleen: Normal in size without focal abnormality. Adrenals/Urinary Tract: Normal adrenal glands. No hydronephrosis or perinephric edema. No renal or ureteral calculi. No obvious renal lesion. Urinary bladder is completely empty and not well assessed. No bladder stone. Stomach/Bowel: Stomach is within normal limits. Appendix appears normal. No evidence of bowel wall thickening, distention, or inflammatory changes. Vascular/Lymphatic: Abdominal  aorta is normal in caliber. Possible prominent periportal node with indistinct margins, series 2, image 27. Small retroperitoneal nodes are not enlarged by size criteria. Reproductive: Uterus and  bilateral adnexa are unremarkable. Other: No free air, free fluid, or intra-abdominal fluid collection. Musculoskeletal: There are no acute or suspicious osseous abnormalities. IMPRESSION: 1. No renal stones or obstructive uropathy. 2. Hepatomegaly. Vague low-density adjacent to the gallbladder fossa in the right lobe of the liver. The adjacent gallbladder wall is indistinct and difficult to delineate from the hepatic low-density. Findings are suspicious for infiltrative or inflammatory process. Gallbladder contents are heterogeneous. Recommend initial evaluation with right upper quadrant ultrasound. Pending ultrasound findings, further evaluation may be needed with abdominal MRI. 3. Questionable prominent periportal node. Electronically Signed   By: Keith Rake M.D.   On: 08/18/2019 02:52     Procedures Procedures (including critical care time)  Medications Ordered in ED Medications  enoxaparin (LOVENOX) injection 40 mg (has no administration in time range)  lactated ringers infusion (has no administration in time range)  oxyCODONE (Oxy IR/ROXICODONE) immediate release tablet 5-10 mg (has no administration in time range)  morphine 2 MG/ML injection 2 mg (has no administration in time range)  methocarbamol (ROBAXIN) tablet 500 mg (has no administration in time range)  zolpidem (AMBIEN) tablet 5 mg (has no administration in time range)  diphenhydrAMINE (BENADRYL) 12.5 MG/5ML elixir 12.5 mg (has no administration in time range)    Or  diphenhydrAMINE (BENADRYL) injection 12.5 mg (has no administration in time range)  ondansetron (ZOFRAN-ODT) disintegrating tablet 4 mg (has no administration in time range)    Or  ondansetron (ZOFRAN) injection 4 mg (has no administration in time range)  simethicone  (MYLICON) chewable tablet 40 mg (has no administration in time range)  metoprolol tartrate (LOPRESSOR) injection 5 mg (has no administration in time range)  gabapentin (NEURONTIN) capsule 300 mg (has no administration in time range)  insulin aspart (novoLOG) injection 0-15 Units (has no administration in time range)  lisinopril (ZESTRIL) tablet 20 mg (has no administration in time range)    And  hydrochlorothiazide (HYDRODIURIL) tablet 25 mg (has no administration in time range)  piperacillin-tazobactam (ZOSYN) IVPB 3.375 g (has no administration in time range)  sodium chloride 0.9 % bolus 1,000 mL (0 mLs Intravenous Stopped 08/25/19 1430)  ondansetron (ZOFRAN) injection 4 mg (4 mg Intravenous Given 08/25/19 1322)  morphine 4 MG/ML injection 4 mg (4 mg Intravenous Given 08/25/19 1323)  piperacillin-tazobactam (ZOSYN) IVPB 3.375 g (0 g Intravenous Stopped 08/25/19 1356)    ED Course  I have reviewed the triage vital signs and the nursing notes.  Pertinent labs & imaging results that were available during my care of the patient were reviewed by me and considered in my medical decision making (see chart for details).  Clinical Course as of Aug 24 1509  Tue Aug 24, 2061  7229 54 year old female here for increased right upper quadrant abdominal pain in setting of having an outpatient ultrasound today.  Ultrasound showing gallstones and sonographic Murphy's.  LFTs elevated.  Will review with general surgery   [MB]  1250 Spoke with Dr. Constance Haw, general surgeon. She agrees this sounds like the patient has cholecystitis. She will admit the patient.  We can place temporarily admission orders and bed request, MedSurg observation.  Clear liquid diet.  N.p.o. at midnight. Agrees with other management of IV fluids and Zosyn. She is in the middle of another surgical case, but will see her in about an hour.   [SJ]    Clinical Course User Index [MB] Hayden Rasmussen, MD [SJ] Layla Maw   MDM  Rules/Calculators/A&P  Patient presents with few weeks right upper quadrant pain. Patient is nontoxic appearing, afebrile, not tachycardic, not tachypneic, not hypotensive, maintains excellent SPO2 on room air.   I have reviewed the patient's chart to obtain more information.   I reviewed and interpreted the patient's labs and radiological studies. Sonographic Murphy's sign.  Cholelithiasis noted on ultrasound.  Also question of possible mass on imaging. Liver enzymes elevated. Possible cholecystitis, however, gallbladder/liver neoplasm not excluded at this time. Admitted via general surgery service for further management.   Findings and plan of care discussed with Ronnald Ramp, MD. Dr. Melina Copa personally evaluated and examined this patient.  Vitals:   08/25/19 1310 08/25/19 1330 08/25/19 1400 08/25/19 1430  BP: (!) 173/91 (!) 157/99 (!) 162/95 (!) 142/95  Pulse: 64 91 65 (!) 53  Resp: 16 18 16 20   Temp:      TempSrc:      SpO2: 100% 100% 96% 100%  Weight:      Height:         Final Clinical Impression(s) / ED Diagnoses Final diagnoses:  Right upper quadrant abdominal pain    Rx / DC Orders ED Discharge Orders    None       Layla Maw 08/25/19 1515    Hayden Rasmussen, MD 08/25/19 1933

## 2019-08-25 NOTE — ED Notes (Signed)
Attempted to give report to inpatient nurse x1 at this time. Awaiting call back to give report

## 2019-08-26 DIAGNOSIS — C23 Malignant neoplasm of gallbladder: Secondary | ICD-10-CM

## 2019-08-26 LAB — CBC WITH DIFFERENTIAL/PLATELET
Abs Immature Granulocytes: 0.02 10*3/uL (ref 0.00–0.07)
Basophils Absolute: 0 10*3/uL (ref 0.0–0.1)
Basophils Relative: 1 %
Eosinophils Absolute: 0.2 10*3/uL (ref 0.0–0.5)
Eosinophils Relative: 3 %
HCT: 32.4 % — ABNORMAL LOW (ref 36.0–46.0)
Hemoglobin: 10.6 g/dL — ABNORMAL LOW (ref 12.0–15.0)
Immature Granulocytes: 0 %
Lymphocytes Relative: 25 %
Lymphs Abs: 1.3 10*3/uL (ref 0.7–4.0)
MCH: 27.9 pg (ref 26.0–34.0)
MCHC: 32.7 g/dL (ref 30.0–36.0)
MCV: 85.3 fL (ref 80.0–100.0)
Monocytes Absolute: 0.7 10*3/uL (ref 0.1–1.0)
Monocytes Relative: 13 %
Neutro Abs: 3.1 10*3/uL (ref 1.7–7.7)
Neutrophils Relative %: 58 %
Platelets: 301 10*3/uL (ref 150–400)
RBC: 3.8 MIL/uL — ABNORMAL LOW (ref 3.87–5.11)
RDW: 14.6 % (ref 11.5–15.5)
WBC: 5.3 10*3/uL (ref 4.0–10.5)
nRBC: 0 % (ref 0.0–0.2)

## 2019-08-26 LAB — COMPREHENSIVE METABOLIC PANEL
ALT: 519 U/L — ABNORMAL HIGH (ref 0–44)
AST: 323 U/L — ABNORMAL HIGH (ref 15–41)
Albumin: 3.3 g/dL — ABNORMAL LOW (ref 3.5–5.0)
Alkaline Phosphatase: 169 U/L — ABNORMAL HIGH (ref 38–126)
Anion gap: 7 (ref 5–15)
BUN: 8 mg/dL (ref 6–20)
CO2: 26 mmol/L (ref 22–32)
Calcium: 8.9 mg/dL (ref 8.9–10.3)
Chloride: 104 mmol/L (ref 98–111)
Creatinine, Ser: 1.1 mg/dL — ABNORMAL HIGH (ref 0.44–1.00)
GFR calc Af Amer: >60 mL/min (ref >60)
GFR calc non Af Amer: 57 mL/min — ABNORMAL LOW (ref >60)
Glucose, Bld: 134 mg/dL — ABNORMAL HIGH (ref 70–99)
Potassium: 4 mmol/L (ref 3.5–5.1)
Sodium: 137 mmol/L (ref 135–145)
Total Bilirubin: 3.1 mg/dL — ABNORMAL HIGH (ref 0.3–1.2)
Total Protein: 6.4 g/dL — ABNORMAL LOW (ref 6.5–8.1)

## 2019-08-26 LAB — CANCER ANTIGEN 19-9: CA 19-9: 186 U/mL — ABNORMAL HIGH (ref 0–35)

## 2019-08-26 LAB — GLUCOSE, CAPILLARY
Glucose-Capillary: 100 mg/dL — ABNORMAL HIGH (ref 70–99)
Glucose-Capillary: 98 mg/dL (ref 70–99)

## 2019-08-26 LAB — CEA: CEA: 1.6 ng/mL (ref 0.0–4.7)

## 2019-08-26 MED ORDER — OXYCODONE HCL 5 MG PO TABS: 5.0000 mg | ORAL_TABLET | ORAL | 0 refills | Status: AC | PRN

## 2019-08-26 MED ORDER — ONDANSETRON 4 MG PO TBDP: 4.0000 mg | ORAL_TABLET | Freq: Four times a day (QID) | ORAL | 0 refills | Status: AC | PRN

## 2019-08-26 NOTE — Discharge Instructions (Signed)
Monitor for Jaundice, fever and chills. If you get these, go to the hospital.   Jaundice, Adult  Jaundice is when the skin, the whites of the eyes, and the lining of the mouth and nose (mucous membranes) turn a yellowish color. It is caused by having too much bilirubin in the blood. Bilirubin is made by the normal breakdown of red blood cells. Having jaundice means that your body's bile system may not be working as it should. The bile system is made up of the liver, the gallbladder, and the bile ducts. They work together to make, store, and move bile. Jaundice may be caused by drinking too much alcohol. It may also be caused by liver disease, infections, cancers, or some medicines. Your doctor may treat you with medicine, fluids, or surgery. Follow these instructions at home:   Take over-the-counter and prescription medicines only as told by your doctor.  You can use skin lotion to help with itching.  Drink plenty of fluids.  Do not drink alcohol.  Keep all follow-up visits as told by your doctor. This is important. Contact a doctor if:  You have a fever.  You have swelling or pain in your belly (abdomen). Get help right away if:  Your symptoms get worse all of a sudden.  Your pain gets worse.  You keep throwing up (vomiting).  You throw up blood.  You become weak or confused.  You get a very bad headache.  You have blood in your poop.  You lose too much body fluid (dehydration). Signs that have you lost too much body fluid include: ? A very dry mouth. ? A fast, weak pulse. ? Fast breathing. ? Blue lips. Summary  Jaundice is when the skin, the whites of the eyes, and the lining of the mouth and nose turn a yellowish color.  Jaundice may be caused by a problem in the liver, the gallbladder, and the bile ducts.  Follow your doctor's instructions for home care. These include drinking plenty of fluid, not drinking alcohol, and taking medicines as told.  Get help right  away if your symptoms get worse, you feel weak or confused, you throw up, you have blood in your poop, you have a very bad headache, or you lose too much body fluid. This information is not intended to replace advice given to you by your health care provider. Make sure you discuss any questions you have with your health care provider. Document Revised: 03/15/2017 Document Reviewed: 03/15/2017 Elsevier Patient Education  2020 [employer].

## 2019-08-26 NOTE — Progress Notes (Signed)
Patient with no complaints at this time. Respirations even and unlabored. Skin warm/dry. Discharge instructions reviewed with patient at this time. Patient given opportunity to voice concerns/ask questions. IV removed per policy and band-aid applied to site. Patient taken to main entrance via wheelchair in good condition.

## 2019-08-26 NOTE — Plan of Care (Signed)

## 2019-08-26 NOTE — Discharge Summary (Signed)
Physician Discharge Summary  Patient ID: Brittany Mack MRN: [id number] DOB/AGE: 55-Sep-1967 55 y.o.  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 is a 54 yo who has had 3 weeks of RUQ pain. She came to the ED last week and had a non contrast CT performed that demonstrated some concern for her gallbladder with thickening and a question of an infiltrative process versus inflammatory. She had a previous UTI that was being treated, and were concerned about stones prompting the CT.  [place] was not available that evening, and she was scheduled to come back for an [place] the next day. The [place] was just completed yesterday, and findings demonstrated a positive Murphy sign with stone concerning for cholecystitis but also a mass adjacent to the gallbladder. She never had fevers, chills, or leukocytosis.  Her only complaint was pain. The ED notified me of cholecystitis and I set the patient up for admission.  On further review of her information and imaging, I realized that this was more than likely a gallbladder cancer. MRI was ordered and confirmed this suspicion. I notified the patient and her sons.  CA 19-9 was elevated at 186.  I talked to Dr. Jyl Heinz at [facility] to get her close follow up, and he is scheduled to see her 6/4 @ 2pm. Her bilirubin and alkaline phos were going up but clinically she has no jaundice.  Given that she has no signs of infection with normal leukocytosis and no fevers or chills, I do not think she has cholecystitis and stopped the antibiotics.   I have given the patient instructions on following up with Dr. Crisoforo Oxford and to watch out for developing fever, chills, sclera icterus, yellow gums, itching.   She has a CD of her images and the images have been pushed to [facility]. I appreciate everyone's assistance with this  case. I have prescribed zofran and roxicodone for pain to help until she is able to get treatment.   Consults: None and Surgical On[facility] Dr. Jyl Heinz via phone   Significant Diagnostic Studies:  Results for WILBURTA, HUSSAIN (MRN [id number]) as of 08/26/2019 09:05  Ref. Range 08/18/2019 02:32 08/25/2019 11:53 08/26/2019 05:27  Sodium Latest Ref Range: 135 - 145 mmol/L 138 141 137  Potassium Latest Ref Range: 3.5 - 5.1 mmol/L 3.2 (L) 4.4 4.0  Chloride Latest Ref Range: 98 - 111 mmol/L 100 105 104  CO2 Latest Ref Range: 22 - 32 mmol/L 29 28 26   Glucose Latest Ref Range: 70 - 99 mg/dL 115 (H) 108 (H) 134 (H)  Mean Plasma Glucose Latest Units: mg/dL  128.37   BUN Latest Ref Range: 6 - 20 mg/dL 23 (H) 10 8  Creatinine Latest Ref Range: 0.44 - 1.00 mg/dL 0.81 1.05 (H) 1.10 (H)  Calcium Latest Ref Range: 8.9 - 10.3 mg/dL 9.0 9.4 8.9  Anion gap Latest Ref Range: 5 - 15  9 8 7   Alkaline Phosphatase Latest Ref Range: 38 - 126 U/L 67 154 (H) 169 (H)  Albumin Latest Ref Range: 3.5 - 5.0 g/dL 4.3 4.1 3.3 (L)  Lipase Latest Ref Range: 11 - 51 U/L 27 22   AST Latest Ref Range: 15 - 41 U/L 16 397 (H) 323 (H)  ALT Latest Ref Range: 0 - 44 U/L 26 621 (H) 519 (H)  Total Protein Latest Ref Range: 6.5 -  8.1 g/dL 7.6 7.6 6.4 (L)  Total Bilirubin Latest Ref Range: 0.3 - 1.2 mg/dL 0.5 1.3 (H) 3.1 (H)  GFR, Est Non African American Latest Ref Range: >60 mL/min >60 >60 57 (L)  GFR, Est African American Latest Ref Range: >60 mL/min >60 >60 >60   Results for LUGENE, DOLLISON (MRN [id number]) as of 08/26/2019 09:05  Ref. Range 08/18/2019 02:32 08/25/2019 11:53 08/26/2019 05:27  Sodium Latest Ref Range: 135 - 145 mmol/L 138 141 137  Potassium Latest Ref Range: 3.5 - 5.1 mmol/L 3.2 (L) 4.4 4.0  Chloride Latest Ref Range: 98 - 111 mmol/L 100 105 104  CO2 Latest Ref Range: 22 - 32 mmol/L 29 28 26   Glucose Latest Ref Range: 70 - 99 mg/dL 115 (H) 108 (H) 134 (H)  Mean Plasma Glucose Latest Units: mg/dL  128.37   BUN Latest  Ref Range: 6 - 20 mg/dL 23 (H) 10 8  Creatinine Latest Ref Range: 0.44 - 1.00 mg/dL 0.81 1.05 (H) 1.10 (H)  Calcium Latest Ref Range: 8.9 - 10.3 mg/dL 9.0 9.4 8.9  Anion gap Latest Ref Range: 5 - 15  9 8 7   Alkaline Phosphatase Latest Ref Range: 38 - 126 U/L 67 154 (H) 169 (H)  Albumin Latest Ref Range: 3.5 - 5.0 g/dL 4.3 4.1 3.3 (L)  Lipase Latest Ref Range: 11 - 51 U/L 27 22   AST Latest Ref Range: 15 - 41 U/L 16 397 (H) 323 (H)  ALT Latest Ref Range: 0 - 44 U/L 26 621 (H) 519 (H)  Total Protein Latest Ref Range: 6.5 - 8.1 g/dL 7.6 7.6 6.4 (L)  Total Bilirubin Latest Ref Range: 0.3 - 1.2 mg/dL 0.5 1.3 (H) 3.1 (H)  GFR, Est Non African American Latest Ref Range: >60 mL/min >60 >60 57 (L)  GFR, Est African American Latest Ref Range: >60 mL/min >60 >60 >60   Treatments: IV hydration and analgesia: Roxicodone, temporary IV zosyn for suspected cholecystitis that was stopped   Discharge Exam: Blood pressure 140/85, pulse 63, temperature 98.6 F (37 C), temperature source Oral, resp. rate 18, height 5\' 6"  (1.676 m), weight 104.3 kg, SpO2 99 %. General appearance: alert, cooperative and no distress Resp: normal work of breathing GI: soft, tender RUQ, mildly distended  No sclera icterus   Disposition: Discharge disposition: 01-Home or Self Care       Discharge Instructions    Call MD for:  difficulty breathing, headache or visual disturbances   Complete by: As directed    Call MD for:  extreme fatigue   Complete by: As directed    Call MD for:  persistant dizziness or light-headedness   Complete by: As directed    Call MD for:  persistant nausea and vomiting   Complete by: As directed    Call MD for:  redness, tenderness, or signs of infection (pain, swelling, redness, odor or green/yellow discharge around incision site)   Complete by: As directed    Call MD for:  severe uncontrolled pain   Complete by: As directed    Call MD for:  temperature >100.4   Complete by: As directed     Increase activity slowly   Complete by: As directed      Allergies as of 08/26/2019   No Known Allergies     Medication List    STOP taking these medications   HYDROcodone-acetaminophen 5-325 MG tablet Commonly known as: Norco     TAKE these medications   Centrum Adults Tabs Take  1 tablet by mouth daily.   gabapentin 300 MG capsule Commonly known as: NEURONTIN Take 300 mg by mouth daily.   ibuprofen 600 MG tablet Commonly known as: ADVIL Take 600 mg by mouth every 6 (six) hours as needed for moderate pain.   lisinopril-hydrochlorothiazide 20-25 MG tablet Commonly known as: ZESTORETIC lisinopril 20 mg-hydrochlorothiazide 25 mg tablet   meloxicam 7.5 MG tablet Commonly known as: Mobic Take 1 tablet (7.5 mg total) by mouth daily. What changed: how much to take   metFORMIN 500 MG tablet Commonly known as: GLUCOPHAGE metformin 500 mg tablet   naproxen sodium 220 MG tablet Commonly known as: ALEVE Take 440 mg by mouth daily as needed (pain).   ondansetron 4 MG disintegrating tablet Commonly known as: ZOFRAN-ODT Take 1 tablet (4 mg total) by mouth every 6 (six) hours as needed for nausea.   oxyCODONE 5 MG immediate release tablet Commonly known as: Oxy IR/ROXICODONE Take 1 tablet (5 mg total) by mouth every 4 (four) hours as needed for severe pain or breakthrough pain.   potassium chloride SA 20 MEQ tablet Commonly known as: KLOR-CON Take 1 tablet (20 mEq total) by mouth 2 (two) times daily.   simvastatin 40 MG tablet Commonly known as: ZOCOR Take 1 tablet by mouth every evening.      Greater than 50% of the 30 minute visit was spent in counseling/ coordination of care regarding the patient's gallbladder cancer and plan for referral to [facility].     Signed: Virl Cagey 08/26/2019, 9:21 AM

## 2019-09-01 DIAGNOSIS — K831 Obstruction of bile duct: Secondary | ICD-10-CM | POA: Insufficient documentation

## 2019-09-10 DIAGNOSIS — C23 Malignant neoplasm of gallbladder: Secondary | ICD-10-CM | POA: Insufficient documentation

## 2019-10-13 DIAGNOSIS — D509 Iron deficiency anemia, unspecified: Secondary | ICD-10-CM | POA: Insufficient documentation

## 2019-11-27 DIAGNOSIS — I10 Essential (primary) hypertension: Secondary | ICD-10-CM | POA: Insufficient documentation

## 2019-11-27 DIAGNOSIS — Z7689 Persons encountering health services in other specified circumstances: Secondary | ICD-10-CM | POA: Insufficient documentation

## 2019-12-08 DIAGNOSIS — C786 Secondary malignant neoplasm of retroperitoneum and peritoneum: Secondary | ICD-10-CM | POA: Insufficient documentation

## 2020-05-24 DIAGNOSIS — G620 Drug-induced polyneuropathy: Secondary | ICD-10-CM | POA: Insufficient documentation

## 2020-05-31 DIAGNOSIS — E785 Hyperlipidemia, unspecified: Secondary | ICD-10-CM | POA: Insufficient documentation

## 2020-07-18 ENCOUNTER — Encounter: Payer: Self-pay | Admitting: Internal Medicine

## 2020-07-23 IMAGING — MR MR ABDOMEN WO/W CM
8 of 18 series · 17 of 48 positions shown · IV contrast (gadavist)
Comparison: Same day right upper quadrant ultrasound, CT abdomen
pelvis, 08/18/2019

CLINICAL DATA: Right upper quadrant pain, liver lesion,
cholelithiasis

EXAM:
MRI ABDOMEN WITHOUT AND WITH CONTRAST
TECHNIQUE: Multiplanar multisequence MR imaging of the abdomen was performed
both before and after the administration of intravenous contrast.
CONTRAST:  10mL GADAVIST GADOBUTROL 1 MMOL/ML IV SOLN

[Series 3: T2 · coronal · 5.0mm · 1.23mm/px · 1 of 40 slices shown (1 of 3)]
[im 1/40]
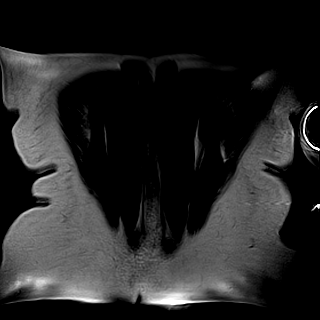

[Series 4: ax dual echo_in · axial · 4.0mm · 0.59mm/px · z∈[-70,+214]mm · 2 of 72 slices shown]
[im 1/72]
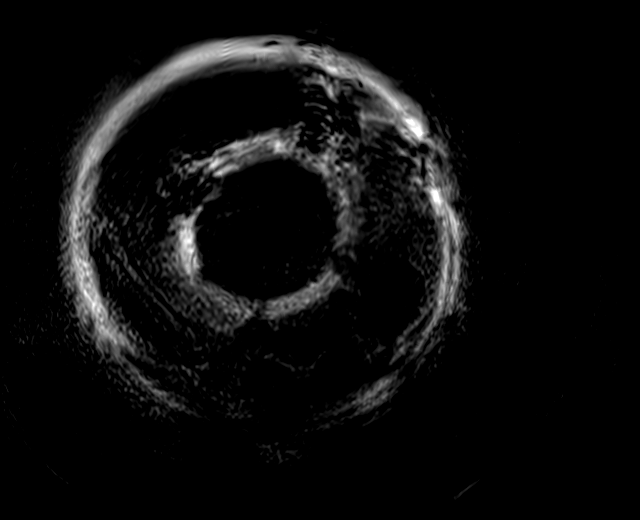
[im 72/72]
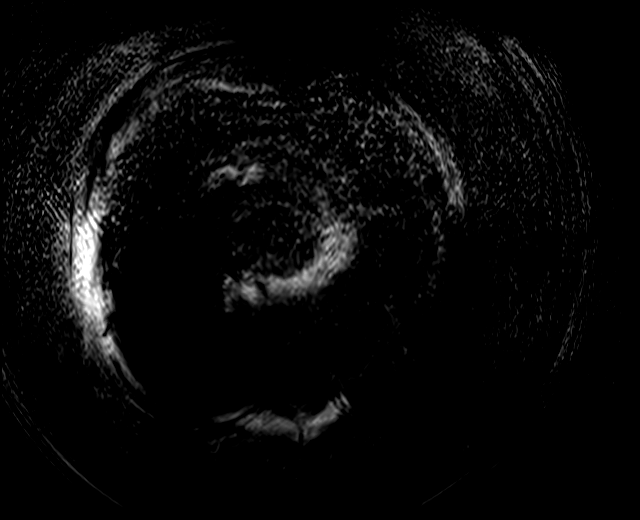

[Series 5: ax dual echo_opp · axial · 4.0mm · 0.59mm/px · z∈[-70,+214]mm · 2 of 72 slices shown]
[im 1/72]
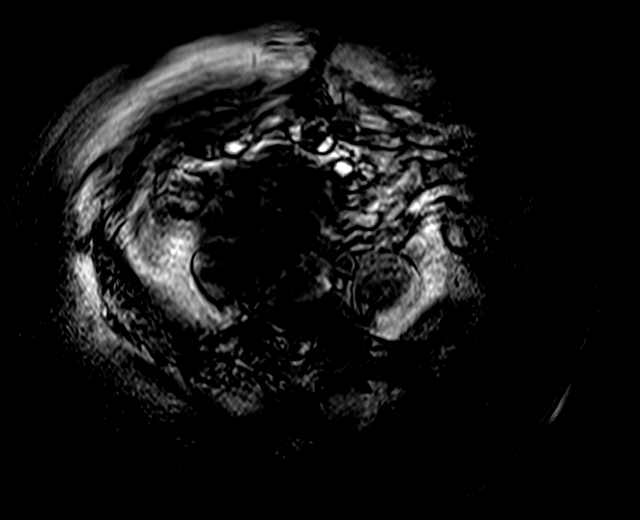
[im 72/72]
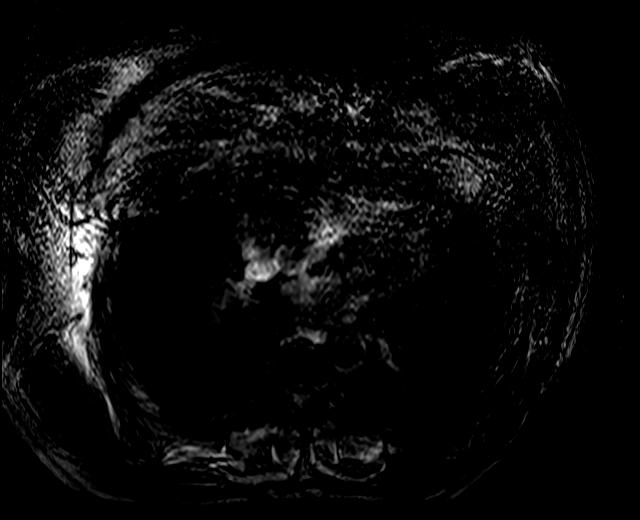

[Series 8: T2 · axial · 5.0mm · 1.19mm/px · z∈[-75,+219]mm · 2 of 50 slices shown (2 of 3)]
[im 1/50]
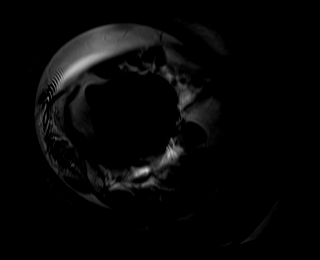
[im 50/50]
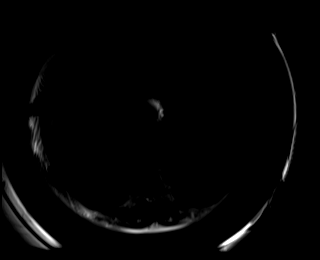

[Series 9: DWI · axial · 5.0mm · 0.99mm/px · z∈[-69,+213]mm · 4 of 96 slices shown]
[im 1/96]
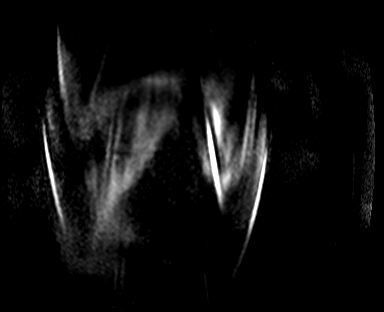
[im 32/96]
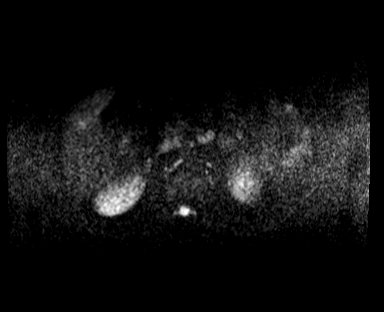
[im 64/96]
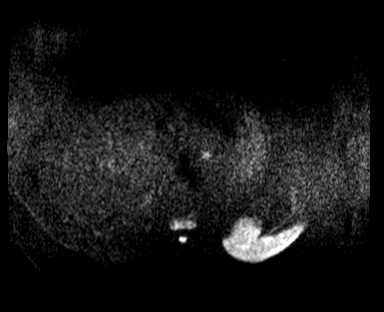
[im 96/96]
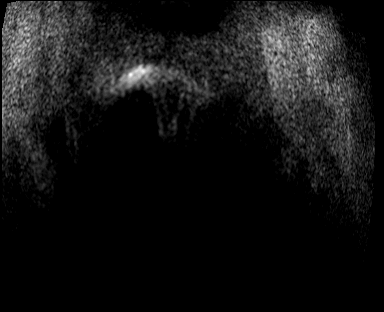

[Series 10: ax dwi_adc · axial · 5.0mm · 0.99mm/px · z∈[-69,+213]mm · 2 of 48 slices shown]
[im 1/48]
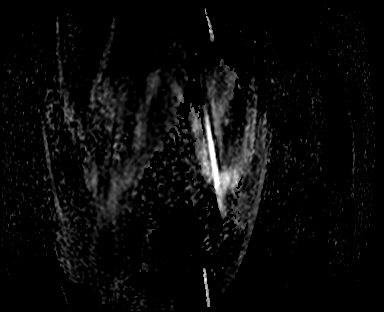
[im 48/48]
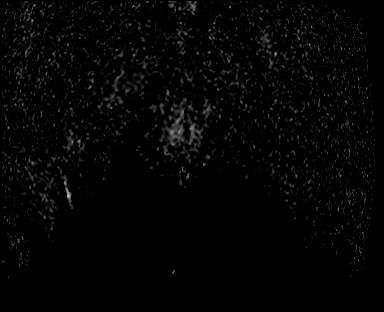

[Series 11: bSSFP · axial · 5.0mm · 0.68mm/px · z∈[-63,+207]mm · 2 of 55 slices shown]
[im 1/55]
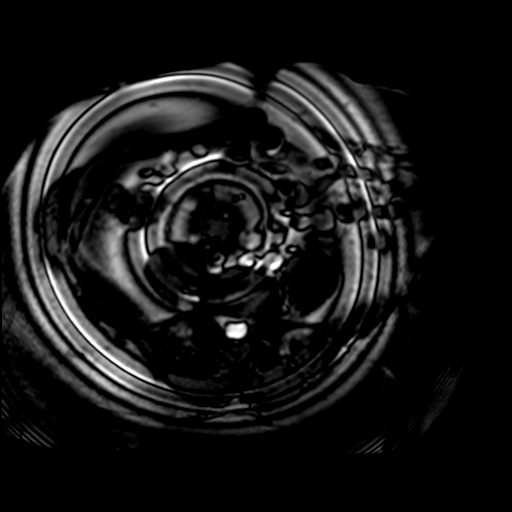
[im 55/55]
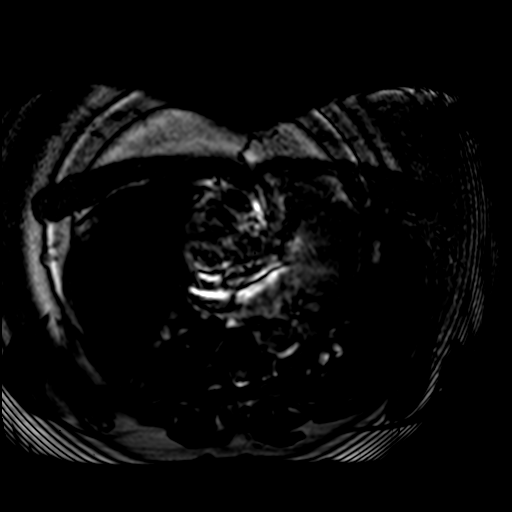

[Series 20: T2 · axial · 5.0mm · 1.48mm/px · z∈[-75,+219]mm · 2 of 50 slices shown (3 of 3)]
[im 1/50]
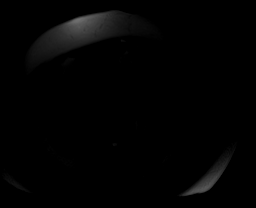
[im 50/50]
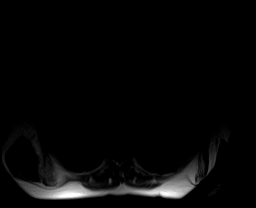

[17 of 48 positions shown; findings below may reference images not displayed]

FINDINGS: Examination of the abdomen is significantly limited by breath motion
artifact throughout.

Lower chest: No acute findings.

Hepatobiliary: There is a large gallstone in the gallbladder fundus.
There is an ill-defined, peripherally enhancing lesion of the right
aspect of the gallbladder fossa, hepatic segment V, which appears to
envelop or arise from a significant portion of the gallbladder wall,
measuring approximately 4.4 x 3.8 x 3.9 cm (series 13, image 49).
This demonstrates internally heterogeneous intrinsic T2
hyperintensity. Hepatic steatosis.

Pancreas: No mass, inflammatory changes, or other parenchymal
abnormality identified.

Spleen:  Within normal limits in size and appearance.

Adrenals/Urinary Tract: No masses identified. No evidence of
hydronephrosis.

Stomach/Bowel: Visualized portions within the abdomen are
unremarkable.

Vascular/Lymphatic: There is a rim enhancing soft tissue nodule or
lymph node in the vicinity of the portacaval station measuring 3.5 x
2.2 cm (series 17, image 43). No abdominal aortic aneurysm
demonstrated.

Other:  None.

Musculoskeletal: No suspicious bone lesions identified.
IMPRESSION: 1. Examination is significantly limited by breath motion artifact
throughout.
2. Within this limitation, there is an ill-defined, peripherally
enhancing lesion of the right aspect of the gallbladder fossa,
hepatic segment V, which appears to envelop or arise from a
significant portion of the gallbladder wall. There is a rim
enhancing soft tissue nodule or lymph node in the vicinity of the
portacaval station. Findings are highly concerning for primary
gallbladder neoplasm. Hepatic phlegmon or abscess secondary to
cholecystitis is less favored given the presence of presumed
abnormal lymph node although remains a differential consideration.
3. Cholelithiasis.
4. Hepatic steatosis.

## 2020-07-26 ENCOUNTER — Ambulatory Visit (HOSPITAL_COMMUNITY): Payer: BLUE CROSS/BLUE SHIELD | Admitting: Hematology

## 2020-07-27 ENCOUNTER — Inpatient Hospital Stay (HOSPITAL_COMMUNITY): Payer: Medicaid Other

## 2020-07-27 ENCOUNTER — Encounter (HOSPITAL_COMMUNITY): Payer: Self-pay | Admitting: Hematology

## 2020-07-27 ENCOUNTER — Other Ambulatory Visit: Payer: Self-pay

## 2020-07-27 ENCOUNTER — Inpatient Hospital Stay (HOSPITAL_COMMUNITY): Payer: Medicaid Other | Attending: Hematology | Admitting: Hematology

## 2020-07-27 VITALS — BP 132/86 | HR 66 | Resp 16 | Ht 66.0 in | Wt 217.4 lb

## 2020-07-27 DIAGNOSIS — D631 Anemia in chronic kidney disease: Secondary | ICD-10-CM | POA: Insufficient documentation

## 2020-07-27 DIAGNOSIS — Z823 Family history of stroke: Secondary | ICD-10-CM | POA: Insufficient documentation

## 2020-07-27 DIAGNOSIS — Z8249 Family history of ischemic heart disease and other diseases of the circulatory system: Secondary | ICD-10-CM | POA: Diagnosis not present

## 2020-07-27 DIAGNOSIS — N1832 Chronic kidney disease, stage 3b: Secondary | ICD-10-CM | POA: Insufficient documentation

## 2020-07-27 DIAGNOSIS — Z833 Family history of diabetes mellitus: Secondary | ICD-10-CM | POA: Diagnosis not present

## 2020-07-27 DIAGNOSIS — D649 Anemia, unspecified: Secondary | ICD-10-CM

## 2020-07-27 DIAGNOSIS — M7989 Other specified soft tissue disorders: Secondary | ICD-10-CM | POA: Diagnosis not present

## 2020-07-27 DIAGNOSIS — Z87891 Personal history of nicotine dependence: Secondary | ICD-10-CM | POA: Insufficient documentation

## 2020-07-27 DIAGNOSIS — R0609 Other forms of dyspnea: Secondary | ICD-10-CM

## 2020-07-27 DIAGNOSIS — E669 Obesity, unspecified: Secondary | ICD-10-CM | POA: Insufficient documentation

## 2020-07-27 DIAGNOSIS — R42 Dizziness and giddiness: Secondary | ICD-10-CM | POA: Insufficient documentation

## 2020-07-27 DIAGNOSIS — C23 Malignant neoplasm of gallbladder: Secondary | ICD-10-CM

## 2020-07-27 DIAGNOSIS — E1122 Type 2 diabetes mellitus with diabetic chronic kidney disease: Secondary | ICD-10-CM | POA: Insufficient documentation

## 2020-07-27 DIAGNOSIS — R21 Rash and other nonspecific skin eruption: Secondary | ICD-10-CM | POA: Diagnosis not present

## 2020-07-27 DIAGNOSIS — Z8261 Family history of arthritis: Secondary | ICD-10-CM | POA: Insufficient documentation

## 2020-07-27 DIAGNOSIS — Z79899 Other long term (current) drug therapy: Secondary | ICD-10-CM | POA: Diagnosis not present

## 2020-07-27 DIAGNOSIS — R5383 Other fatigue: Secondary | ICD-10-CM | POA: Insufficient documentation

## 2020-07-27 DIAGNOSIS — I129 Hypertensive chronic kidney disease with stage 1 through stage 4 chronic kidney disease, or unspecified chronic kidney disease: Secondary | ICD-10-CM | POA: Insufficient documentation

## 2020-07-27 LAB — RETICULOCYTES
Immature Retic Fract: 17.3 % — ABNORMAL HIGH (ref 2.3–15.9)
RBC.: 3.49 MIL/uL — ABNORMAL LOW (ref 3.87–5.11)
Retic Count, Absolute: 95.3 10*3/uL (ref 19.0–186.0)
Retic Ct Pct: 2.7 % (ref 0.4–3.1)

## 2020-07-27 LAB — CBC WITH DIFFERENTIAL/PLATELET
Abs Immature Granulocytes: 0.02 10*3/uL (ref 0.00–0.07)
Basophils Absolute: 0 10*3/uL (ref 0.0–0.1)
Basophils Relative: 1 %
Eosinophils Absolute: 0.1 10*3/uL (ref 0.0–0.5)
Eosinophils Relative: 2 %
HCT: 33.3 % — ABNORMAL LOW (ref 36.0–46.0)
Hemoglobin: 10.7 g/dL — ABNORMAL LOW (ref 12.0–15.0)
Immature Granulocytes: 0 %
Lymphocytes Relative: 33 %
Lymphs Abs: 2.2 10*3/uL (ref 0.7–4.0)
MCH: 30.7 pg (ref 26.0–34.0)
MCHC: 32.1 g/dL (ref 30.0–36.0)
MCV: 95.7 fL (ref 80.0–100.0)
Monocytes Absolute: 0.7 10*3/uL (ref 0.1–1.0)
Monocytes Relative: 11 %
Neutro Abs: 3.5 10*3/uL (ref 1.7–7.7)
Neutrophils Relative %: 53 %
Platelets: 192 10*3/uL (ref 150–400)
RBC: 3.48 MIL/uL — ABNORMAL LOW (ref 3.87–5.11)
RDW: 17.4 % — ABNORMAL HIGH (ref 11.5–15.5)
WBC: 6.6 10*3/uL (ref 4.0–10.5)
nRBC: 0 % (ref 0.0–0.2)

## 2020-07-27 LAB — VITAMIN D 25 HYDROXY (VIT D DEFICIENCY, FRACTURES): Vit D, 25-Hydroxy: 40.11 ng/mL (ref 30–100)

## 2020-07-27 LAB — COMPREHENSIVE METABOLIC PANEL
ALT: 22 U/L (ref 0–44)
AST: 19 U/L (ref 15–41)
Albumin: 4.2 g/dL (ref 3.5–5.0)
Alkaline Phosphatase: 89 U/L (ref 38–126)
Anion gap: 7 (ref 5–15)
BUN: 34 mg/dL — ABNORMAL HIGH (ref 6–20)
CO2: 28 mmol/L (ref 22–32)
Calcium: 9.7 mg/dL (ref 8.9–10.3)
Chloride: 104 mmol/L (ref 98–111)
Creatinine, Ser: 1.4 mg/dL — ABNORMAL HIGH (ref 0.44–1.00)
GFR, Estimated: 44 mL/min — ABNORMAL LOW (ref >60)
Glucose, Bld: 86 mg/dL (ref 70–99)
Potassium: 3.4 mmol/L — ABNORMAL LOW (ref 3.5–5.1)
Sodium: 139 mmol/L (ref 135–145)
Total Bilirubin: 0.5 mg/dL (ref 0.3–1.2)
Total Protein: 7.8 g/dL (ref 6.5–8.1)

## 2020-07-27 LAB — IRON AND TIBC
Iron: 81 ug/dL (ref 28–170)
Saturation Ratios: 18 % (ref 10.4–31.8)
TIBC: 452 ug/dL — ABNORMAL HIGH (ref 250–450)
UIBC: 371 ug/dL

## 2020-07-27 LAB — LACTATE DEHYDROGENASE: LDH: 176 U/L (ref 98–192)

## 2020-07-27 LAB — FOLATE: Folate: 43.3 ng/mL (ref >5.9)

## 2020-07-27 LAB — FERRITIN: Ferritin: 388 ng/mL — ABNORMAL HIGH (ref 11–307)

## 2020-07-27 LAB — VITAMIN B12: Vitamin B-12: 1400 pg/mL — ABNORMAL HIGH (ref 180–914)

## 2020-07-27 NOTE — Progress Notes (Signed)
[place], [place] [address]   CLINIC:  Medical Oncology/Hematology  CONSULT NOTE  Patient Care Team: Coolidge Breeze, FNP as PCP - General (Family Medicine) Gala Romney, Cristopher Estimable, MD as Consulting Physician (Gastroenterology)  CHIEF COMPLAINTS/PURPOSE OF CONSULTATION:  Referred by PCP for anemia  HISTORY OF PRESENTING ILLNESS:  Brittany Mack 55 y.o. female is here at the request of her primary care provider (NP Suzzanne Cloud of [facility]) due to anemia.  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  She completed treatment with FOLFOX, and has recently switched to oral Xeloda.  She follows with her oncologist Dr. Emeterio Reeve at [facility].  She received Injectafer on 10/20/2019 and 11/03/2019.  Review of labs sent by PCP show hemoglobin 9.8 with MCV 94.2 (06/30/2020) as well as creatinine 1.68 with her baseline CKD stage IIIb.  She denies recent chest pain on exertion, pre-syncopal episodes, or palpitations. She had not noticed any recent bleeding such as epistaxis, hematuria, hematochezia, or melena.  She does have some fatigue, reports energy level at 20%, and does experience occasional dyspnea on exertion.  The patient denies over the counter NSAID ingestion. She is not on antiplatelets agents. Her last colonoscopy was about 5 years ago, reportedly normal.  She denies any pica and eats a variety of diet. She never donated blood or received blood transfusion She does not take iron pills at home.  Her PMH is otherwise notable for CKD stage IIIb, stage IV adenocarcinoma of the gallbladder, hypertension, non-insulin-dependent type 2 diabetes mellitus, and osteoarthritis.  Ms. Pinkham lives with a friend.  She is functional with ADLs, but requires frequent rest.  She has a remote 2-year history of  cigarette smoking, but quit in 1984.  She denies alcohol or illicit drug use.  No family history of cancer or anemia.   MEDICAL HISTORY:  Past Medical History:  Diagnosis Date  . Arthritis   . Cancer ([place])    Gallbladder cancer 08/26/2019  . Diabetes mellitus without complication ([place])   . Hypertension     SURGICAL HISTORY: Past Surgical History:  Procedure Laterality Date  . ANKLE SURGERY     right  . CESAREAN SECTION      SOCIAL HISTORY: Social History   Socioeconomic History  . Marital status: Single    Spouse name: Not on file  . Number of children: 6  . Years of education: Not on file  . Highest education level: Not on file  Occupational History  . Not on file  Tobacco Use  . Smoking status: Former Smoker    Packs/day: 0.50    Years: 2.00    Pack years: 1.00    Quit date: 06/02/1982    Years since quitting: 38.1  . Smokeless tobacco: Never Used  Substance and Sexual Activity  . Alcohol use: No  . Drug use: No  . Sexual activity: Yes  Other Topics Concern  . Not on file  Social History Narrative  . Not on file   Social Determinants of Health   Financial Resource Strain: High Risk  . Difficulty of Paying Living Expenses: Hard  Food Insecurity: No Food Insecurity  . Worried About Charity fundraiser in the Last Year: Never true  . Ran Out of Food in the Last Year: Never true  Transportation Needs: No Transportation Needs  .  Lack of Transportation (Medical): No  . Lack of Transportation (Non-Medical): No  Physical Activity: Insufficiently Active  . Days of Exercise per Week: 2 days  . Minutes of Exercise per Session: 60 min  Stress: No Stress Concern Present  . Feeling of Stress : Not at all  Social Connections: Unknown  . Frequency of Communication with Friends and Family: More than three times a week  . Frequency of Social Gatherings with Friends and Family: Twice a week  . Attends Religious Services: More than 4 times per year  . Active Member of  Clubs or Organizations: No  . Attends Archivist Meetings: Never  . Marital Status: Patient refused  Intimate Partner Violence: Not At Risk  . Fear of Current or Ex-Partner: No  . Emotionally Abused: No  . Physically Abused: No  . Sexually Abused: No    FAMILY HISTORY: Family History  Problem Relation Age of Onset  . Rheum arthritis Mother   . Hypertension Mother   . Stroke Father   . Heart failure Father   . Hypertension Sister   . Diabetes Brother     ALLERGIES:  is allergic to wound dressing adhesive.  MEDICATIONS:  Current Outpatient Medications  Medication Sig Dispense Refill  . capecitabine (XELODA) 500 MG tablet Take 3 tablets by mouth 2 times daily for 14 days then rest for 7 days    . Cholecalciferol 50 MCG (2000 UT) CAPS Take by mouth.    . Cyanocobalamin 5000 MCG LOZG Take by mouth.    . diclofenac Sodium (VOLTAREN) 1 % GEL diclofenac 1 % topical gel  USE AS DIRECTED TO HANDS AND FEET TWICE DAILY FOR NEUROPATHY    . DULoxetine (CYMBALTA) 30 MG capsule duloxetine 30 mg capsule,delayed release  TAKE 1 CAPSULE BY MOUTH EVERY DAY AT BEDTIME FOR NERVE PAINS    . Elderberry 575 MG/5ML SYRP Take by mouth.    . gabapentin (NEURONTIN) 300 MG capsule Take 300 mg by mouth daily.    Marland Kitchen lisinopril-hydrochlorothiazide (ZESTORETIC) 20-25 MG tablet lisinopril 20 mg-hydrochlorothiazide 25 mg tablet    . meloxicam (MOBIC) 7.5 MG tablet Take 1 tablet (7.5 mg total) by mouth daily. (Patient taking differently: Take 15 mg by mouth daily.) 30 tablet 5  . metFORMIN (GLUCOPHAGE) 500 MG tablet metformin 500 mg tablet    . Multiple Vitamins-Minerals (CENTRUM ADULTS) TABS Take 1 tablet by mouth daily.    . naproxen sodium (ANAPROX) 220 MG tablet Take 440 mg by mouth daily as needed (pain).    Marland Kitchen oxyCODONE (OXY IR/ROXICODONE) 5 MG immediate release tablet Take 1 tablet (5 mg total) by mouth every 4 (four) hours as needed for severe pain or breakthrough pain. 15 tablet 0  .  Pancrelipase, Lip-Prot-Amyl, (CREON) 24000-76000 units CPEP Creon 24,000-76,000-120,000 unit capsule,delayed release    . pregabalin (LYRICA) 150 MG capsule pregabalin 150 mg capsule    . prochlorperazine (COMPAZINE) 10 MG tablet prochlorperazine maleate 10 mg tablet  TAKE 1 TABLET BY MOUTH EVERY 6 HOURS AS NEEDED FOR NAUSEA    . pyridoxine (B-6) 100 MG tablet Take by mouth.    . simvastatin (ZOCOR) 40 MG tablet Take 1 tablet by mouth every evening.    . urea (CARMOL) 10 % cream urea 10 % topical cream  APPLY A SMALL AMOUNT OF CREAM TO THE PALMS OF YOUR HANDS AND THE SOLES OF YOUR FEET TWICE DAILY ONCE YOU BEGIN ORAL CHEMOTHERAPY    . acetaminophen (TYLENOL) 325 MG tablet Take by mouth. (  Patient not taking: Reported on 07/27/2020)    . lidocaine-prilocaine (EMLA) cream lidocaine-prilocaine 2.5 %-2.5 % topical cream (Patient not taking: Reported on 07/27/2020)    . ondansetron (ZOFRAN-ODT) 4 MG disintegrating tablet Take 1 tablet (4 mg total) by mouth every 6 (six) hours as needed for nausea. (Patient not taking: Reported on 07/27/2020) 20 tablet 0   No current facility-administered medications for this visit.    REVIEW OF SYSTEMS:   Review of Systems  Constitutional: Positive for fatigue. Negative for appetite change, chills, diaphoresis, fever and unexpected weight change.  HENT:   Negative for lump/mass and nosebleeds.   Eyes: Negative for eye problems.  Respiratory: Negative for cough, hemoptysis and shortness of breath.   Cardiovascular: Positive for leg swelling (Chronic right leg swelling related to arthritis). Negative for chest pain and palpitations.  Gastrointestinal: Positive for constipation and diarrhea. Negative for abdominal pain, blood in stool, nausea and vomiting.  Genitourinary: Negative for hematuria.   Skin: Positive for rash (beneath right breast).  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 Negative for light-headedness.  Hematological: Does not bruise/bleed easily.  Psychiatric/Behavioral: Positive for sleep disturbance.      PHYSICAL EXAMINATION: ECOG PERFORMANCE STATUS: 1 - Symptomatic but completely ambulatory  Vitals:   07/27/20 0933  BP: 132/86  Pulse: 66  Resp: 16  SpO2: 100%   Filed Weights   07/27/20 0933  Weight: 217 lb 6 oz (98.6 kg)    Physical Exam Constitutional:      Appearance: Normal appearance. She is obese.  HENT:     Head: Normocephalic and atraumatic.     Mouth/Throat:     Mouth: Mucous membranes are moist.  Eyes:     Extraocular Movements: Extraocular movements intact.     Pupils: Pupils are equal, round, and reactive to light.  Cardiovascular:     Rate and Rhythm: Normal rate and regular rhythm.     Pulses: Normal pulses.     Heart sounds: Normal heart sounds.  Pulmonary:     Effort: Pulmonary effort is normal.     Breath sounds: Normal breath sounds.  Abdominal:     General: Bowel sounds are normal.     Palpations: Abdomen is soft.     Tenderness: There is abdominal tenderness (RUQ).  Musculoskeletal:        General: No swelling.     Right lower leg: Edema (1 + edema of RLE, knee brace in place) present.     Left lower leg: No edema.  Lymphadenopathy:     Cervical: No cervical adenopathy.  Skin:    General: Skin is warm and dry.     Findings: Rash (skin beneath right breast is moist with whitish exudate and hyperpigmented satellite lesions) present.  Neurological:     General: No focal deficit present.     Mental Status: She is alert and oriented to person, place, and time.  Psychiatric:        Mood and Affect: Mood normal.        Behavior: Behavior normal.       LABORATORY DATA:  I have reviewed the data as listed Recent Results (from the past 2160 hour(s))  Vitamin B12     Status: Abnormal   Collection Time: 07/27/20 10:42 AM  Result Value Ref Range   Vitamin B-12 1,400 (H) 180 - 914 pg/mL    Comment:  (NOTE) This assay is not validated for testing neonatal or myeloproliferative  syndrome specimens for Vitamin B12 levels. Performed at [facility], [address], [place], [place] [address]   VITAMIN D 25 Hydroxy (Vit-D Deficiency, Fractures)     Status: None   Collection Time: 07/27/20 10:42 AM  Result Value Ref Range   Vit D, 25-Hydroxy 40.11 30 - 100 ng/mL    Comment: (NOTE) Vitamin D deficiency has been defined by the [place] practice guideline as a level of serum 25-OH  vitamin D less than 20 ng/mL (1,2). The Endocrine Society went on to  further define vitamin D insufficiency as a level between 21 and 29  ng/mL (2).  1. IOM (Institute of Medicine). 2010. Dietary reference intakes for  calcium and D. [place]: The [employer]. 2. Holick MF, Binkley Adrian, Bischoff-Ferrari HA, et al. Evaluation,  treatment, and prevention of vitamin D deficiency: an Endocrine  Society clinical practice guideline, JCEM. 2011 Jul; 96(7): 1911-30.  Performed at [facility], [place] [address], [place], [place] [address]   Folate     Status: None   Collection Time: 07/27/20 10:42 AM  Result Value Ref Range   Folate 43.3 >5.9 ng/mL    Comment: RESULTS CONFIRMED BY MANUAL DILUTION Performed at [facility], [address], [place], [place] [address]   CBC with Differential/Platelet     Status: Abnormal   Collection Time: 07/27/20 10:42 AM  Result Value Ref Range   WBC 6.6 4.0 - 10.5 K/uL   RBC 3.48 (L) 3.87 - 5.11 MIL/uL   Hemoglobin 10.7 (L) 12.0 - 15.0 g/dL   HCT 33.3 (L) 36.0 - 46.0 %   MCV 95.7 80.0 - 100.0 fL   MCH 30.7 26.0 - 34.0 pg   MCHC 32.1 30.0 - 36.0 g/dL   RDW 17.4 (H) 11.5 - 15.5 %   Platelets 192 150 - 400 K/uL   nRBC 0.0 0.0 - 0.2 %   Neutrophils Relative % 53 %   Neutro Abs 3.5 1.7 - 7.7 K/uL   Lymphocytes Relative 33 %   Lymphs Abs 2.2 0.7 - 4.0 K/uL   Monocytes Relative 11 %   Monocytes Absolute 0.7 0.1 - 1.0  K/uL   Eosinophils Relative 2 %   Eosinophils Absolute 0.1 0.0 - 0.5 K/uL   Basophils Relative 1 %   Basophils Absolute 0.0 0.0 - 0.1 K/uL   Immature Granulocytes 0 %   Abs Immature Granulocytes 0.02 0.00 - 0.07 K/uL    Comment: Performed at [facility], [address], [place], [place] [address]  Comprehensive metabolic panel     Status: Abnormal   Collection Time: 07/27/20 10:42 AM  Result Value Ref Range   Sodium 139 135 - 145 mmol/L   Potassium 3.4 (L) 3.5 - 5.1 mmol/L   Chloride 104 98 - 111 mmol/L   CO2 28 22 - 32 mmol/L   Glucose, Bld 86 70 - 99 mg/dL    Comment: Glucose reference range applies only to samples taken after fasting for at least 8 hours.   BUN 34 (H) 6 - 20 mg/dL   Creatinine, Ser 1.40 (H) 0.44 - 1.00 mg/dL   Calcium 9.7 8.9 - 10.3 mg/dL   Total Protein 7.8 6.5 - 8.1 g/dL   Albumin 4.2 3.5 - 5.0 g/dL   AST 19 15 - 41 U/L   ALT 22 0 - 44 U/L   Alkaline Phosphatase 89 38 - 126 U/L   Total Bilirubin 0.5 0.3 - 1.2  mg/dL   GFR, Estimated 44 (L) >60 mL/min    Comment: (NOTE) Calculated using the CKD-EPI Creatinine Equation (2021)    Anion gap 7 5 - 15    Comment: Performed at [facility], [address], [place], [place] [address]  Ferritin     Status: Abnormal   Collection Time: 07/27/20 10:42 AM  Result Value Ref Range   Ferritin 388 (H) 11 - 307 ng/mL    Comment: Performed at [facility], [address] [place], [place] [address]  Iron and TIBC     Status: Abnormal   Collection Time: 07/27/20 10:42 AM  Result Value Ref Range   Iron 81 28 - 170 ug/dL   TIBC 452 (H) 250 - 450 ug/dL   Saturation Ratios 18 10.4 - 31.8 %   UIBC 371 ug/dL    Comment: Performed at [facility], [address], [place], [place] [address]  Lactate dehydrogenase     Status: None   Collection Time: 07/27/20 10:42 AM  Result Value Ref Range   LDH 176 98 - 192 U/L    Comment: Performed at [facility], [address], [place], [place] [address]  Reticulocytes     Status: Abnormal    Collection Time: 07/27/20 10:42 AM  Result Value Ref Range   Retic Ct Pct 2.7 0.4 - 3.1 %   RBC. 3.49 (L) 3.87 - 5.11 MIL/uL   Retic Count, Absolute 95.3 19.0 - 186.0 K/uL   Immature Retic Fract 17.3 (H) 2.3 - 15.9 %    Comment: Performed at [facility], [address], [place],  [address]    RADIOGRAPHIC STUDIES: I have personally reviewed the radiological images as listed and agreed with the findings in the report. No results found.  ASSESSMENT: 1.  Normocytic anemia - Suspect related to CKD, chronic disease, chemotherapy, and cancer (gallbladder adenocarcinoma) - No signs or symptoms of bleeding - Routine colonoscopy about 5 years ago, reportedly normal - She received Injectafer on 10/20/2019 and 11/03/2019. - Review of labs sent by PCP show hemoglobin 9.8 with MCV 94.2 (06/30/2020) as well as creatinine 1.68 with her baseline CKD stage IIIb.  PLAN:  1.  Normocytic anemia - Patient was informed that she could follow-up with Dr. Emeterio Reeve for her anemia as well as her cancer, she states that she would like to see [place] for management of her anemia and due to the ease of access and closer location to her home - Check anemia panel (CBC, CMP, erythropoietin, SPEP, nutritional panel, and reticulocytes) and stool cards - RTC in 2-3 weeks to discuss results  2.  Stage IV adenocarcinoma of the gallbladder - Diagnosed 08/18/2019 - Currently on Xeloda (previously on Gemcitabine/Cisplatin/Abraxane, and then was on FOLFOX) - Follows with Dr. Eduard Clos at [facility], next appointment in on 08/04/2020  3.  CKD stage IIIb - Baseline kidney function with creatinine clearance of about 40 cc/min - She sees Dr. Lanora Manis in [place]  4.  Rash - Possibly candidal intertrigo, patient advised to keep area clean and dry, apply over-the-counter fungal powders - If rash does not resolve, patient instructed to follow-up with her PCP for treatment and for possible referral to  dermatology   PLAN SUMMARY & DISPOSITION: -Labs today -Phone visit in 2 to 3 weeks to discuss results  All questions were answered. The patient knows to call the clinic with any problems, questions or concerns.   Medical decision making: Moderate (1 new problem of uncertain prognosis, review of  prior lab results, ordering new tests, review of external notes)  Time spent on visit: I spent 25 minutes counseling the patient face to face. The total time spent in the appointment was 40 minutes and more than 50% was on counseling.  I, Tarri Abernethy PA-C, have seen this patient in conjunction with Dr. Derek Jack. Greater than 50% of visit was performed by Dr. Delton Coombes.  Addendum: I have independently performed face-to-face evaluation of this patient and reviewed her records and agree with HPI written by Casey Burkitt, PA-C.  Multifactorial normocytic anemia secondary to CKD, myelosuppression from chemotherapy with Xeloda for gallbladder cancer.  We will evaluate for various nutritional deficiencies.  We will discuss the plan in 2 to 3 weeks.    Derek Jack, MD 07/27/20 7:38 PM

## 2020-07-27 NOTE — Patient Instructions (Signed)
[place] at [facility] Discharge Instructions  You were seen today by Dr. Delton Coombes and Tarri Abernethy PA-C for your anemia.  We suspect that this anemia is related to your current cancer diagnosis, chemotherapy, and chronic kidney disease.  You can choose to see Dr. Emeterio Reeve for management of your anemia (along with your cancer), or you can continue to see him for your cancer only, and we can manage your anemia.    LABS: Check labs today before leaving   OTHER TESTS: None  MEDICATIONS: No changes  FOLLOW-UP APPOINTMENT: Return in 2-3 weeks to discuss results.  Continue to follow with Dr. Emeterio Reeve for cancer treatment.   Thank you for choosing [place] at [facility] to provide your oncology and hematology care.  To afford each patient quality time with our provider, please arrive at least 15 minutes before your scheduled appointment time.   If you have a lab appointment with the [facility] please come in thru the Main Entrance and check in at the main information desk.  You need to re-schedule your appointment should you arrive 10 or more minutes late.  We strive to give you quality time with our providers, and arriving late affects you and other patients whose appointments are after yours.  Also, if you no show three or more times for appointments you may be dismissed from the clinic at the providers discretion.     Again, thank you for choosing [facility].  Our hope is that these requests will decrease the amount of time that you wait before being seen by our physicians.       _____________________________________________________________  Should you have questions after your visit to [facility], please contact our office at [phone] and follow the prompts.  Our office hours are 8:00 a.m. and 4:30 p.m. Monday - Friday.  Please note that voicemails left after 4:00 p.m. may not be returned until the  following business day.  We are closed weekends and major holidays.  You do have access to a nurse 24-7, just call the main number to the clinic [phone] and do not press any options, hold on the line and a nurse will answer the phone.    For prescription refill requests, have your pharmacy contact our office and allow 72 hours.    Due to Covid, you will need to wear a mask upon entering the hospital. If you do not have a mask, a mask will be given to you at the Main Entrance upon arrival. For doctor visits, patients may have 1 support person age 56 or older with them. For treatment visits, patients can not have anyone with them due to social distancing guidelines and our immunocompromised population.

## 2020-07-28 LAB — KAPPA/LAMBDA LIGHT CHAINS
Kappa free light chain: 29.2 mg/L — ABNORMAL HIGH (ref 3.3–19.4)
Kappa, lambda light chain ratio: 1.4 (ref 0.26–1.65)
Lambda free light chains: 20.9 mg/L (ref 5.7–26.3)

## 2020-07-28 LAB — ERYTHROPOIETIN: Erythropoietin: 22.9 m[IU]/mL — ABNORMAL HIGH (ref 2.6–18.5)

## 2020-07-29 DIAGNOSIS — C23 Malignant neoplasm of gallbladder: Secondary | ICD-10-CM | POA: Diagnosis not present

## 2020-07-29 LAB — PROTEIN ELECTROPHORESIS, SERUM
A/G Ratio: 1.2 (ref 0.7–1.7)
Albumin ELP: 4.1 g/dL (ref 2.9–4.4)
Alpha-1-Globulin: 0.3 g/dL (ref 0.0–0.4)
Alpha-2-Globulin: 0.6 g/dL (ref 0.4–1.0)
Beta Globulin: 1.2 g/dL (ref 0.7–1.3)
Gamma Globulin: 1.3 g/dL (ref 0.4–1.8)
Globulin, Total: 3.4 g/dL (ref 2.2–3.9)
Total Protein ELP: 7.5 g/dL (ref 6.0–8.5)

## 2020-07-29 LAB — OCCULT BLOOD X 1 CARD TO LAB, STOOL
Fecal Occult Bld: NEGATIVE
Fecal Occult Bld: NEGATIVE
Fecal Occult Bld: POSITIVE — AB

## 2020-07-29 LAB — COPPER, SERUM: Copper: 150 ug/dL (ref 80–158)

## 2020-08-02 LAB — IMMUNOFIXATION ELECTROPHORESIS
IgA: 284 mg/dL (ref 87–352)
IgG (Immunoglobin G), Serum: 1578 mg/dL (ref 586–1602)
IgM (Immunoglobulin M), Srm: 48 mg/dL (ref 26–217)
Total Protein ELP: 7.7 g/dL (ref 6.0–8.5)

## 2020-08-02 LAB — METHYLMALONIC ACID, SERUM: Methylmalonic Acid, Quantitative: 161 nmol/L (ref 0–378)

## 2020-08-11 ENCOUNTER — Other Ambulatory Visit: Payer: Self-pay

## 2020-08-11 ENCOUNTER — Inpatient Hospital Stay (HOSPITAL_BASED_OUTPATIENT_CLINIC_OR_DEPARTMENT_OTHER): Payer: Medicaid Other | Admitting: Physician Assistant

## 2020-08-11 DIAGNOSIS — D649 Anemia, unspecified: Secondary | ICD-10-CM | POA: Diagnosis not present

## 2020-08-11 NOTE — Progress Notes (Signed)
Virtual Visit via Telephone Note [facility]  I connected with Brittany Mack on 08/11/20  at 2:07 PM by telephone and verified that I am speaking with the correct person using two identifiers.  Location: Patient: Home Provider: [facility]   I discussed the limitations, risks, security and privacy concerns of performing an evaluation and management service by telephone and the availability of in person appointments. I also discussed with the patient that there may be a patient responsible charge related to this service. The patient expressed understanding and agreed to proceed.   History of Present Illness: [name] is contacted today for follow up of her anemia.  She was seen for initial consultation by Dr. Delton Coombes and Tarri Abernethy PA-C on 07/27/2020.  She returns today to discuss recently completed work-up of her anemia.  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  She did have 1 out of 3 stool cards come back positive for occult blood, and reports that she will reach out to her own gastroenterologist via [facility] for further follow-up.  She continues to follow with Dr. Emeterio Reeve for treatment of her stage IV gallbladder adenocarcinoma.  She is on Xeloda treatment, previously was on gemcitabine/cisplatin/Abraxane, and then was switched to FOLFOX before changing to Xeloda.  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  She reports that her energy level is 25%, appetite 25%.  She reports that her weight has been stable.  She denies any bright red blood per rectum or melena, no other signs of blood loss such as epistaxis, hemoptysis, hematemesis, hematuria.  She admits to constant abdominal pain as well as intermittent nausea without vomiting.  She continues to have night sweats, but is uncertain if this is related to  her cancer, her chemo, or her perimenopausal status.  She denies chest pain, shortness of breath, fever, chills, unintentional weight loss.    Observations/Objective: Review of Systems  Constitutional: Positive for diaphoresis and malaise/fatigue. Negative for chills, fever and weight loss.  HENT: Negative for nosebleeds.   Respiratory: Negative for cough and shortness of breath.   Cardiovascular: Negative for chest pain and palpitations.  Gastrointestinal: Positive for abdominal pain and nausea. Negative for blood in stool, constipation, diarrhea and vomiting.     PHYSICAL EXAM (per limitations of virtual telephone visit): The patient is alert and oriented x 3, exhibiting adequate mentation, good mood, and ability to speak in full sentences and execute sound judgement.   Assessment: 1.  Normocytic anemia - Suspect related to CKD, chronic disease, chemotherapy, and cancer (gallbladder adenocarcinoma) - No signs or symptoms of bleeding - Routine colonoscopy about 5 years ago, reportedly normal - She received Injectafer on 10/20/2019 and 11/03/2019. - Review of labs sent by PCP show hemoglobin 9.8 with MCV 94.2 (06/30/2020) as well as creatinine 1.68 with her baseline CKD stage IIIb. - Work-up for other causes of anemia was unremarkable: Normal SPEP/light chain/IFE; normal B12/MMA, folate, copper, vitamin D; LDH normal; reticulocytes 2.7, erythropoietin 22.9; ferritin was increased at 388 with mildly decreased iron saturation 18% and elevated TIBC 452, in keeping with anemia of chronic disease -Stool cards came back with 1 out of 3 positive for occult blood   Plan: 1.  Normocytic anemia -  Work-up does not find any cause for anemia apart from anemia related to CKD, cancer, chemotherapy, and chronic disease - Regarding 1 out of  3 Hemoccult positive stool samples, patient reports that she will reach out to her previously established gastroenterologist at [facility] -Patient follows with  oncology at [facility] (Dr. Emeterio Reeve) for her gallbladder adenocarcinoma, but he is also addressed her anemia in the past - Since work-up did not show any other causes of anemia, she will continue to follow with him for ongoing monitoring and treatment of her anemia, as it is most likely related to her chemotherapy and underlying cancerous process -Since patient will be following with oncology at [facility], we will discharge her from our clinic, but have informed her that she is welcome to reach out for [place] if there is anything we can assist with in the future  2.  Stage IV adenocarcinoma of the gallbladder - Diagnosed 08/18/2019 - Currently on Xeloda (previously on Gemcitabine/Cisplatin/Abraxane, and then was on FOLFOX) - Follows with Dr. Eduard Clos at [facility], next appointment in on 08/04/2020  3.  CKD stage IIIb - Baseline kidney function with creatinine clearance of about 40 cc/min - She sees Dr. Lanora Manis in [facility]   Follow Up Instructions: - Patient will follow with her oncologist at [facility] for treatment of her anemia, so we will discharge her from our clinic, but we remain available to assist if needed.  I discussed the assessment and treatment plan with the patient. The patient was provided an opportunity to ask questions and all were answered. The patient agreed with the plan and demonstrated an understanding of the instructions.   The patient was advised to call back or seek an in-person evaluation if the symptoms worsen or if the condition fails to improve as anticipated.  I provided 12 minutes of non-face-to-face time during this encounter.   Harriett Rush, PA-C

## 2020-08-17 ENCOUNTER — Telehealth (HOSPITAL_COMMUNITY): Payer: Medicaid Other | Admitting: Physician Assistant

## 2020-10-24 ENCOUNTER — Ambulatory Visit: Payer: Medicaid Other | Admitting: Gastroenterology

## 2020-10-24 ENCOUNTER — Encounter: Payer: Self-pay | Admitting: Internal Medicine

## 2020-10-26 ENCOUNTER — Ambulatory Visit: Payer: Medicaid Other | Admitting: Pain Medicine

## 2020-12-01 DIAGNOSIS — R809 Proteinuria, unspecified: Secondary | ICD-10-CM | POA: Insufficient documentation

## 2020-12-01 DIAGNOSIS — D631 Anemia in chronic kidney disease: Secondary | ICD-10-CM | POA: Insufficient documentation

## 2020-12-01 DIAGNOSIS — N1832 Chronic kidney disease, stage 3b: Secondary | ICD-10-CM | POA: Insufficient documentation

## 2020-12-01 DIAGNOSIS — E1122 Type 2 diabetes mellitus with diabetic chronic kidney disease: Secondary | ICD-10-CM | POA: Insufficient documentation

## 2020-12-01 DIAGNOSIS — N189 Chronic kidney disease, unspecified: Secondary | ICD-10-CM | POA: Insufficient documentation

## 2020-12-28 ENCOUNTER — Ambulatory Visit: Payer: Medicaid Other | Attending: Pain Medicine | Admitting: Pain Medicine

## 2020-12-28 ENCOUNTER — Other Ambulatory Visit: Payer: Self-pay

## 2020-12-28 VITALS — BP 129/85 | HR 91 | Temp 96.8°F | Resp 16 | Ht 66.0 in | Wt 220.0 lb

## 2020-12-28 DIAGNOSIS — G620 Drug-induced polyneuropathy: Secondary | ICD-10-CM | POA: Diagnosis not present

## 2020-12-28 DIAGNOSIS — G894 Chronic pain syndrome: Secondary | ICD-10-CM | POA: Diagnosis not present

## 2020-12-28 DIAGNOSIS — G629 Polyneuropathy, unspecified: Secondary | ICD-10-CM | POA: Insufficient documentation

## 2020-12-28 DIAGNOSIS — G893 Neoplasm related pain (acute) (chronic): Secondary | ICD-10-CM | POA: Diagnosis not present

## 2020-12-28 DIAGNOSIS — K219 Gastro-esophageal reflux disease without esophagitis: Secondary | ICD-10-CM | POA: Insufficient documentation

## 2020-12-28 DIAGNOSIS — G8929 Other chronic pain: Secondary | ICD-10-CM | POA: Insufficient documentation

## 2020-12-28 DIAGNOSIS — Z789 Other specified health status: Secondary | ICD-10-CM | POA: Diagnosis present

## 2020-12-28 DIAGNOSIS — M899 Disorder of bone, unspecified: Secondary | ICD-10-CM | POA: Diagnosis present

## 2020-12-28 DIAGNOSIS — C23 Malignant neoplasm of gallbladder: Secondary | ICD-10-CM | POA: Insufficient documentation

## 2020-12-28 DIAGNOSIS — T451X5A Adverse effect of antineoplastic and immunosuppressive drugs, initial encounter: Secondary | ICD-10-CM | POA: Diagnosis present

## 2020-12-28 DIAGNOSIS — Z6834 Body mass index (BMI) 34.0-34.9, adult: Secondary | ICD-10-CM | POA: Insufficient documentation

## 2020-12-28 DIAGNOSIS — Z79899 Other long term (current) drug therapy: Secondary | ICD-10-CM | POA: Diagnosis present

## 2020-12-28 DIAGNOSIS — N289 Disorder of kidney and ureter, unspecified: Secondary | ICD-10-CM | POA: Insufficient documentation

## 2020-12-28 DIAGNOSIS — D649 Anemia, unspecified: Secondary | ICD-10-CM | POA: Insufficient documentation

## 2020-12-28 MED ORDER — VITAMIN B-12 5000 MCG SL SUBL: 5000.0000 ug | SUBLINGUAL_TABLET | Freq: Every day | SUBLINGUAL | 0 refills | Status: AC

## 2020-12-28 MED ORDER — ALPHA-LIPOIC ACID 600 MG PO CAPS: 600.0000 mg | ORAL_CAPSULE | Freq: Every day | ORAL | 0 refills | Status: AC

## 2020-12-28 NOTE — Progress Notes (Signed)
Safety precautions to be maintained throughout the outpatient stay will include: orient to surroundings, keep bed in low position, maintain call bell within reach at all times, provide assistance with transfer out of bed and ambulation.  

## 2020-12-28 NOTE — Progress Notes (Signed)
Patient: Brittany Mack  Service Category: E/M  Provider: Gaspar Cola, MD  DOB: 1966/02/22  DOS: 12/28/2020  Referring Provider: Coolidge Breeze, FNP  MRN: [id number]  Setting: Ambulatory outpatient  PCP: Coolidge Breeze, FNP  Type: New Patient  Specialty: Interventional Pain Management    Location: Office  Delivery: Face-to-face     Primary Reason(s) for Visit: Encounter for initial evaluation of one or more chronic problems (new to examiner) potentially causing chronic pain, and posing a threat to normal musculoskeletal function. (Level of risk: High) CC: Hand Pain (bilateral) and Foot Pain (bilateral)  HPI  [name] is a 55 y.o. year old, female patient, who comes for the first time to our practice referred by Coolidge Breeze, FNP for our initial evaluation of her chronic pain. 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 Today she comes in for evaluation of her Hand Pain (bilateral) and Foot Pain (bilateral)  Pain Assessment: Location: Right, Left Hand Radiating: denies Onset: More than a month ago (started after  taking chemo) Duration: Chronic pain Quality: Numbness, Sharp, Tingling Severity: 6 /10 (subjective, self-reported pain score)  Effect on ADL: holding things, twisting, picking up things. Feet bilateral feels like she is walking on rocks Timing: Constant Modifying factors: "Nothing" BP: 129/85  HR: 91  Onset and Duration: Gradual, pain started after receiving chemo treatments  Cause of pain:  chemo Severity: Getting worse, NAS-11 at its worse: 10/10, NAS-11 at its best: 6/10, NAS-11 now: 6/10, and NAS-11 on the average: 6/10 Timing: Not influenced by the time of the day Aggravating Factors: Lifiting, Prolonged sitting, and Prolonged standing Alleviating Factors: Medications Associated Problems: Fatigue Quality of Pain: Aching, Constant, Distressing, Pressure-like, and Tingling Previous Examinations or Tests: CT scan, MRI scan, and X-rays Previous Treatments: Narcotic medications and Steroid treatments by mouth  According to the patient she was diagnosed with stage IV gallbladder cancer.  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  She indicates having been given a prescription for oxycodone IR 5 mg that she can take up to 1 tablet p.o. every 4 hours as needed, but she indicates only taking 1 to 2 tablets/week.  In addition to that she was given Lyrica 150 mg p.o. twice daily which she indicates she only takes when she hurts.  She refers that her oncologist is Dr. Jess Barters at [facility].  She apparently last saw him around June 2021.  Today I have provided the patient with some recommendations starting with changing the way that she takes her Lyrica from a PRN schedule to a regular one, since it takes 2 to 3 weeks of taking the medication on a regular basis for adequate blood levels to be reached.  I have explained to her that the idea of the medication is to prevent the pain from happening and therefore  the  only way to do that is to have it on board all the time.  In addition to that I have recommended that she try some vitamin B12 as well as alpha lipoid acid.  Her condition, unfortunately is one where the interventional therapies are very limited.  Historic Controlled Substance Pharmacotherapy Review  PMP and historical list of controlled substances: Pregabalin 75 mg, 1 cap p.o. twice daily; pregabalin 150 mg, 1 cap p.o. twice daily; gabapentin 300 mg, 2 caps p.o. 3 times daily; oxycodone IR 5 mg (# 5), 1 tab p.o. 5 times per day Current opioid analgesics: .   None MME/day: 0 mg/day   Historical Monitoring: The patient  reports no history of drug use. List of all UDS Test(s): No results found for: MDMA, COCAINSCRNUR, [place], [place], CANNABQUANT, [place], [place] List of other Serum/Urine Drug Screening Test(s):  No results found for: AMPHSCRSER, BARBSCRSER, BENZOSCRSER, COCAINSCRSER, COCAINSCRNUR, PCPSCRSER, PCPQUANT, THCSCRSER, THCU, CANNABQUANT, OPIATESCRSER, OXYSCRSER, PROPOXSCRSER, ETH Historical Background Evaluation: [place] PMP: PDMP not reviewed this encounter. Online review of the past 80-monthperiod conducted.             PMP NARX Score Report:  Narcotic: 140 Sedative: 200 Stimulant: 000 [place] Department of public safety, offender search: (Editor, commissioningInformation) Non-contributory Risk Assessment Profile: Aberrant behavior: None observed or detected today Risk factors for fatal opioid overdose: None identified today PMP NARX Overdose Risk Score: 190 Fatal overdose hazard ratio (HR): Calculation deferred Non-fatal overdose hazard ratio (HR): Calculation deferred Risk of opioid abuse or dependence: 0.7-3.0% with doses ? 36 MME/day and 6.1-26% with doses ? 120 MME/day. Substance use disorder (SUD) risk level: See below Personal History of Substance Abuse (SUD-Substance use disorder):  Alcohol: Negative  Illegal Drugs: Negative  Rx Drugs: Negative  ORT Risk Level calculation: Low Risk  Opioid  Risk Tool - 12/28/20 1118       Family History of Substance Abuse   Alcohol Negative    Illegal Drugs Negative    Rx Drugs Negative      Personal History of Substance Abuse   Alcohol Negative    Illegal Drugs Negative    Rx Drugs Negative      Psychological Disease   Psychological Disease Negative    Depression Negative      Total Score   Opioid Risk Tool Scoring 0    Opioid Risk Interpretation Low Risk            ORT Scoring interpretation table:  Score <3 = Low Risk for SUD  Score between 4-7 = Moderate Risk for SUD  Score >8 = High Risk for Opioid Abuse   PHQ-2 Depression Scale:  Total score: 0  PHQ-2 Scoring interpretation table: (Score and probability of major depressive disorder)  Score 0 = No depression  Score 1 = 15.4% Probability  Score 2 = 21.1% Probability  Score 3 = 38.4% Probability  Score 4 = 45.5% Probability  Score 5 = 56.4% Probability  Score 6 = 78.6% Probability   PHQ-9 Depression Scale:  Total score: 2  PHQ-9 Scoring interpretation table:  Score 0-4 = No depression  Score 5-9 = Mild depression  Score 10-14 = Moderate depression  Score 15-19 = Moderately severe depression  Score 20-27 = Severe depression (2.4 times higher risk of SUD and 2.89 times higher risk of overuse)   Pharmacologic Plan: As per protocol, I have not taken over any controlled substance management, pending the results of ordered tests and/or consults.  Initial impression: Pending review of available data and ordered tests.  Meds   Current Outpatient Medications:    Acetaminophen (TYLENOL) 325 MG CAPS, 1 capsule as needed, Disp: , Rfl:    Alpha-Lipoic Acid 600 MG CAPS, Take 1 capsule (600 mg total) by mouth daily., Disp: 30 capsule, Rfl: 0   amitriptyline (ELAVIL) 150 MG tablet, Take 1 tablet by mouth daily., Disp: , Rfl:    Artificial Saliva (MUCOSITISRX MT), Swish and spit 30 mLs 4 times daily., Disp: , Rfl:    Cyanocobalamin (VITAMIN B 12) 500 MCG TABS, 1  lozenge, Disp: , Rfl:    Cyanocobalamin (VITAMIN B-12) 5000 MCG SUBL, Place 1 tablet (5,000 mcg total) under the tongue daily., Disp: 30 tablet, Rfl: 0   diclofenac Sodium (VOLTAREN) 1 % GEL, diclofenac 1 % topical gel  USE AS DIRECTED TO HANDS AND FEET TWICE DAILY FOR NEUROPATHY, Disp: , Rfl:    DULoxetine (CYMBALTA) 30 MG capsule, duloxetine 30 mg capsule,delayed release  TAKE 1 CAPSULE BY MOUTH EVERY DAY AT BEDTIME FOR NERVE PAINS, Disp: , Rfl:    Elderberry 575 MG/5ML SYRP, Take by mouth., Disp: , Rfl:    famotidine (PEPCID) 20 MG tablet, Take 20 mg by mouth 2 (two) times daily as needed., Disp: , Rfl:    hydrOXYzine (ATARAX/VISTARIL) 25 MG tablet, Take 25 mg by mouth 3 (three) times daily as needed., Disp: , Rfl:    lidocaine-prilocaine (EMLA) cream, , Disp: , Rfl:    lisinopril-hydrochlorothiazide (ZESTORETIC) 20-25 MG tablet, lisinopril 20 mg-hydrochlorothiazide 25 mg tablet, Disp: , Rfl:    meloxicam (MOBIC) 15 MG tablet, meloxicam 15 mg tablet  TAKE 1 TABLET BY MOUTH EVERY DAY WITH MEALS, Disp: , Rfl:    Multiple Vitamins-Minerals (CENTRUM ADULTS) TABS, Take 1 tablet by mouth daily., Disp: , Rfl:    nystatin (MYCOSTATIN/NYSTOP) powder, Apply thin layer to affected area twice a day, Disp: , Rfl:    omeprazole (PRILOSEC) 40 MG capsule, 1 capsule 30 minutes before morning meal, Disp: , Rfl:    ondansetron (ZOFRAN-ODT) 4 MG disintegrating tablet, Take 1 tablet (4 mg total) by mouth every 6 (six) hours as needed for nausea., Disp: 20 tablet, Rfl: 0   oxyCODONE (OXY IR/ROXICODONE) 5 MG immediate release tablet, Take 1 tablet (5 mg total) by mouth every 4 (four) hours as needed for severe pain or breakthrough pain., Disp: 15 tablet, Rfl: 0   Pancrelipase, Lip-Prot-Amyl, (CREON) 24000-76000 units CPEP, Creon 24,000-76,000-120,000 unit capsule,delayed release, Disp: , Rfl:    pregabalin (LYRICA) 75 MG capsule, 1 capsule, Disp: , Rfl:    prochlorperazine (COMPAZINE) 10 MG tablet, prochlorperazine  maleate 10 mg tablet  TAKE 1 TABLET BY MOUTH EVERY 6 HOURS AS NEEDED FOR NAUSEA, Disp: , Rfl:    pyridoxine (B-6) 100 MG tablet, Take by mouth., Disp: , Rfl:    urea (CARMOL) 10 % cream, urea 10 % topical cream  APPLY A SMALL AMOUNT OF CREAM TO THE PALMS OF YOUR HANDS AND THE SOLES OF YOUR FEET TWICE DAILY ONCE YOU BEGIN ORAL CHEMOTHERAPY, Disp: , Rfl:   Imaging Review   Complexity Note: No results found under the [employer] electronic medical record.                        ROS  Cardiovascular: High blood pressure Pulmonary or Respiratory: Shortness of breath and Snoring  Neurological: No reported neurological signs or symptoms such as seizures, abnormal skin sensations, urinary and/or fecal incontinence,  being born with an abnormal open spine and/or a tethered spinal cord Psychological-Psychiatric: No reported psychological or psychiatric signs or symptoms such as difficulty sleeping, anxiety, depression, delusions or hallucinations (schizophrenial), mood swings (bipolar disorders) or suicidal ideations or attempts Gastrointestinal: Heartburn due to stomach pushing into lungs (Hiatal hernia) Genitourinary: Kidney disease Hematological: Weakness due to low blood hemoglobin or red blood cell count (Anemia) Endocrine: High blood sugar controlled without the use of insulin (NIDDM) Rheumatologic: No reported rheumatological signs and symptoms such as fatigue, joint pain, tenderness, swelling, redness, heat, stiffness, decreased range of motion, with or without associated rash Musculoskeletal: Negative for myasthenia gravis, muscular dystrophy, multiple sclerosis or malignant hyperthermia Work History: Disabled  Allergies  [name] is allergic to wound dressing adhesive.  Laboratory Chemistry Profile   Renal Lab Results  Component Value Date   BUN 19 12/28/2020   CREATININE 1.43 (H) 12/28/2020   BCR 13 12/28/2020   GFRAA >60 08/26/2019   GFRNONAA 44 (L) 07/27/2020   PROTEINUR  NEGATIVE 08/25/2019     Electrolytes Lab Results  Component Value Date   NA 140 12/28/2020   K 3.7 12/28/2020   CL 100 12/28/2020   CALCIUM 9.7 12/28/2020   MG 2.1 12/28/2020     Hepatic Lab Results  Component Value Date   AST 193 (H) 12/28/2020   ALT 22 07/27/2020   ALBUMIN 4.4 12/28/2020   ALKPHOS 215 (H) 12/28/2020   LIPASE 22 08/25/2019     ID Lab Results  Component Value Date   HIV Non Reactive 08/25/2019   SARSCOV2NAA NEGATIVE 08/25/2019     Bone Lab Results  Component Value Date   VD25OH 40.11 07/27/2020   25OHVITD1 62 12/28/2020   25OHVITD2 <1.0 12/28/2020   25OHVITD3 61 12/28/2020     Endocrine Lab Results  Component Value Date   GLUCOSE 88 12/28/2020   GLUCOSEU NEGATIVE 08/25/2019   HGBA1C 6.1 (H) 08/25/2019     Neuropathy Lab Results  Component Value Date   VITAMINB12 >2000 (H) 12/28/2020   FOLATE 43.3 07/27/2020   HGBA1C 6.1 (H) 08/25/2019   HIV Non Reactive 08/25/2019     CNS No results found for: COLORCSF, APPEARCSF, RBCCOUNTCSF, WBCCSF, POLYSCSF, LYMPHSCSF, EOSCSF, PROTEINCSF, GLUCCSF, JCVIRUS, CSFOLI, IGGCSF, LABACHR, ACETBL, LABACHR, ACETBL   Inflammation (CRP: Acute  ESR: Chronic) Lab Results  Component Value Date   CRP 12 (H) 12/28/2020   ESRSEDRATE 60 (H) 12/28/2020     Rheumatology No results found for: RF, ANA, LABURIC, URICUR, LYMEIGGIGMAB, LYMEABIGMQN, HLAB27   Coagulation Lab Results  Component Value Date   PLT 192 07/27/2020     Cardiovascular Lab Results  Component Value Date   HGB 10.7 (L) 07/27/2020   HCT 33.3 (L) 07/27/2020     Screening Lab Results  Component Value Date   SARSCOV2NAA NEGATIVE 08/25/2019   HIV Non Reactive 08/25/2019     Cancer No results found for: CEA, CA125, LABCA2   Allergens No results found for: ALMOND, APPLE, ASPARAGUS, AVOCADO, BANANA, BARLEY, BASIL, BAYLEAF, GREENBEAN, LIMABEAN, WHITEBEAN, BEEFIGE, REDBEET, BLUEBERRY, BROCCOLI, CABBAGE, MELON, CARROT, CASEIN, CASHEWNUT,  CAULIFLOWER, CELERY     Note: Lab results reviewed.  PFSH  Drug: [name]  reports no history of drug use. Alcohol:  reports no history of alcohol use. Tobacco:  reports that she quit smoking about 38 years ago. She has a 1.00 pack-year smoking history. She has never used smokeless tobacco. Medical:  has a past medical history of Arthritis, Cancer ([place]), Diabetes mellitus without complication ([place]),  and Hypertension. Family: family history includes Diabetes in her brother; Heart failure in her father; Hypertension in her mother and sister; Rheum arthritis in her mother; Stroke in her father.  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[place]) 09/10/2019   Anemia in chronic kidney disease 12/01/2020   Body mass index (BMI) 34.0-34.9, adult 12/28/2020   Carpal tunnel syndrome 07/30/2019   Chemotherapy-induced peripheral neuropathy ([place]) 05/24/2020   Peripheral neuropathy 12/28/2020   Essential hypertension 11/27/2019   Hyperlipidemia, unspecified 05/31/2020   Impaired renal function 12/28/2020   Hemoglobin low 12/28/2020   Iron deficiency anemia 10/13/2019   Obstructive jaundice 09/01/2019   Proteinuria, unspecified 12/01/2020   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[place]) 12/08/2019   Sleep concern 11/27/2019   Stage 3b chronic kidney disease ([place]) 12/01/2020   Type 2 diabetes mellitus with diabetic chronic kidney disease ([place]) 12/01/2020   Chronic pain syndrome 12/28/2020   Pharmacologic therapy 12/28/2020   Disorder of skeletal system 12/28/2020   Problems influencing health status  12/28/2020   Peripheral neuropathy due to chemotherapy ([place]) 12/28/2020   Cancer related pain 12/28/2020   Resolved Ambulatory Problems    Diagnosis Date Noted   No Resolved Ambulatory Problems   Past Medical History:  Diagnosis Date   Arthritis    Cancer ([place])    Diabetes mellitus without complication ([place])    Hypertension    Constitutional Exam  General appearance: Well nourished, well developed, and well hydrated. In no apparent acute distress Vitals:   12/28/20 1100  BP: 129/85  Pulse: 91  Resp: 16  Temp: (!) 96.8 F (36 C)  SpO2: 100%  Weight: 220 lb (99.8 kg)  Height: _0  (1.676 m)   BMI Assessment: Estimated body mass index is 35.51 kg/m as calculated from the following:   Height as of this encounter: _1  (1.676 m).   Weight as of this encounter: 220 lb (99.8 kg).  BMI interpretation table: BMI level Category Range association with higher incidence of chronic pain  <18 kg/m2 Underweight   18.5-24.9 kg/m2 Ideal body weight   25-29.9 kg/m2 Overweight Increased incidence by 20%  30-34.9 kg/m2 Obese (Class I) Increased incidence by 68%  35-39.9 kg/m2 Severe obesity (Class II) Increased incidence by 136%  >40 kg/m2 Extreme obesity (Class III) Increased incidence by 254%   Patient's current BMI Ideal Body weight  Body mass index is 35.51 kg/m. Ideal body weight: 59.3 kg (130 lb 11.7 oz) Adjusted ideal body weight: 75.5 kg (166 lb 7 oz)   BMI Readings from Last 4 Encounters:  12/28/20 35.51 kg/m  07/27/20 35.09 kg/m  08/25/19 37.12 kg/m  08/17/19 37.12 kg/m   Wt Readings from Last 4 Encounters:  12/28/20 220 lb (99.8 kg)  07/27/20 217 lb 6 oz (98.6 kg)  08/25/19 230 lb (104.3 kg)  08/17/19 230 lb (104.3 kg)    Psych/Mental status: Alert, oriented x 3 (person, place, & time)       Eyes: PERLA Respiratory: No evidence of acute respiratory distress  Assessment  Primary Diagnosis & Pertinent Problem List: The primary encounter diagnosis was Cancer  related  pain. 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  Visit Diagnosis (New problems to examiner): 1. Cancer related pain   2. Chemotherapy-induced peripheral neuropathy ([place])   3. Peripheral neuropathy due to chemotherapy ([place])   4. 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 Chronic pain syndrome   6. Pharmacologic therapy   7. Disorder of skeletal system   8. Problems influencing health status   9. Encounter for chronic pain management    Plan of Care (Initial workup plan)  Note: [name] was reminded that as per protocol, today's visit has been an evaluation only. We have not taken over the patient's controlled substance management.  Problem-specific plan: No problem-specific Assessment & Plan notes found for this encounter. Lab Orders         Compliance Drug Analysis, Ur         Comp. Metabolic Panel (12)         Magnesium         Vitamin B12         Sedimentation rate         25-Hydroxy vitamin D Lcms D2+D3         C-reactive protein     Imaging Orders  No imaging studies ordered today   Referral Orders  No referral(s) requested today   Procedure Orders    No procedure(s) ordered today   Pharmacotherapy (current): Medications ordered:  Meds ordered this encounter  Medications   Alpha-Lipoic Acid 600 MG CAPS    Sig: Take 1 capsule (600 mg total) by mouth daily.    Dispense:  30 capsule    Refill:  0    Do not place medication on "Automatic Refill". Fill one day early if pharmacy is closed on scheduled refill date.   Cyanocobalamin (VITAMIN B-12) 5000 MCG SUBL    Sig: Place 1 tablet (5,000 mcg total) under the tongue daily.    Dispense:  30 tablet    Refill:  0    Fill 1 day  early if pharmacy is closed on scheduled refill date. Generic permitted. Do not send renewal requests.    Medications administered during this visit: Deshunda Brittany Mack had no medications administered during this visit.   Pharmacological management options:  Opioid Analgesics: The patient was informed that there is no guarantee that she would be a candidate for opioid analgesics. The decision will be made following CDC guidelines. This decision will be based on the results of diagnostic studies, as well as [name]'s risk profile.   Membrane stabilizer: To be determined at a later time  Muscle relaxant: To be determined at a later time  NSAID: To be determined at a later time  Other analgesic(s): To be determined at a later time   Interventional management options: [name] was informed that there is no guarantee that she would be a candidate for interventional therapies. The decision will be based on the results of diagnostic studies, as well as [name]'s risk profile.  Procedure(s) under consideration:  Pending results of ordered studies     Interventional Therapies  Risk  Complexity Considerations:   Estimated body mass index is 35.51 kg/m as calculated from the following:   Height as of this encounter: _0  (1.676 m).   Weight as of this encounter: 220 lb (99.8  kg). WNL   Planned  Pending:   Pending further evaluation   Under consideration:      Completed:   None at this time   Therapeutic  Palliative (PRN) options:   None established    Provider-requested follow-up: Return in about 2 weeks (around 01/11/2021) for (54mn), Eval-day (M,W), (F2F), 2nd Visit, for review of ordered tests.  Future Appointments  Date Time Provider [place] 01/04/2021 10:00 AM NMilinda Pointer MD [facility] None     Note by: FGaspar Cola MD Date: 12/28/2020; Time: 7:31 PM

## 2021-01-02 LAB — COMP. METABOLIC PANEL (12)
AST: 193 IU/L — ABNORMAL HIGH (ref 0–40)
Albumin/Globulin Ratio: 1.3 (ref 1.2–2.2)
Albumin: 4.4 g/dL (ref 3.8–4.9)
Alkaline Phosphatase: 215 IU/L — ABNORMAL HIGH (ref 44–121)
BUN/Creatinine Ratio: 13 (ref 9–23)
BUN: 19 mg/dL (ref 6–24)
Bilirubin Total: <0.2 mg/dL (ref 0.0–1.2)
Calcium: 9.7 mg/dL (ref 8.7–10.2)
Chloride: 100 mmol/L (ref 96–106)
Creatinine, Ser: 1.43 mg/dL — ABNORMAL HIGH (ref 0.57–1.00)
Globulin, Total: 3.5 g/dL (ref 1.5–4.5)
Glucose: 88 mg/dL (ref 70–99)
Potassium: 3.7 mmol/L (ref 3.5–5.2)
Sodium: 140 mmol/L (ref 134–144)
Total Protein: 7.9 g/dL (ref 6.0–8.5)
eGFR: 43 mL/min/{1.73_m2} — ABNORMAL LOW (ref >59)

## 2021-01-02 LAB — VITAMIN B12: Vitamin B-12: >2000 pg/mL — ABNORMAL HIGH (ref 232–1245)

## 2021-01-02 LAB — MAGNESIUM: Magnesium: 2.1 mg/dL (ref 1.6–2.3)

## 2021-01-02 LAB — SEDIMENTATION RATE: Sed Rate: 60 mm/hr — ABNORMAL HIGH (ref 0–40)

## 2021-01-02 LAB — 25-HYDROXY VITAMIN D LCMS D2+D3
25-Hydroxy, Vitamin D-2: <1 ng/mL
25-Hydroxy, Vitamin D-3: 61 ng/mL
25-Hydroxy, Vitamin D: 62 ng/mL

## 2021-01-02 LAB — C-REACTIVE PROTEIN: CRP: 12 mg/L — ABNORMAL HIGH (ref 0–10)

## 2021-01-02 NOTE — Progress Notes (Deleted)
No-show to appointment today.

## 2021-01-03 DIAGNOSIS — R748 Abnormal levels of other serum enzymes: Secondary | ICD-10-CM | POA: Insufficient documentation

## 2021-01-03 DIAGNOSIS — R700 Elevated erythrocyte sedimentation rate: Secondary | ICD-10-CM | POA: Insufficient documentation

## 2021-01-03 DIAGNOSIS — R7982 Elevated C-reactive protein (CRP): Secondary | ICD-10-CM | POA: Insufficient documentation

## 2021-01-03 DIAGNOSIS — N189 Chronic kidney disease, unspecified: Secondary | ICD-10-CM | POA: Insufficient documentation

## 2021-01-03 DIAGNOSIS — R7989 Other specified abnormal findings of blood chemistry: Secondary | ICD-10-CM | POA: Insufficient documentation

## 2021-01-03 LAB — COMPLIANCE DRUG ANALYSIS, UR

## 2021-01-04 ENCOUNTER — Ambulatory Visit: Payer: Medicaid Other | Attending: Pain Medicine | Admitting: Pain Medicine

## 2021-01-04 ENCOUNTER — Other Ambulatory Visit: Payer: Self-pay | Admitting: Pain Medicine

## 2021-01-04 DIAGNOSIS — R748 Abnormal levels of other serum enzymes: Secondary | ICD-10-CM

## 2021-01-04 DIAGNOSIS — R7982 Elevated C-reactive protein (CRP): Secondary | ICD-10-CM

## 2021-01-04 DIAGNOSIS — N189 Chronic kidney disease, unspecified: Secondary | ICD-10-CM

## 2021-01-04 DIAGNOSIS — Z91199 Patient's noncompliance with other medical treatment and regimen due to unspecified reason: Secondary | ICD-10-CM

## 2021-01-04 DIAGNOSIS — R700 Elevated erythrocyte sedimentation rate: Secondary | ICD-10-CM

## 2021-01-04 DIAGNOSIS — C23 Malignant neoplasm of gallbladder: Secondary | ICD-10-CM

## 2021-01-04 DIAGNOSIS — N289 Disorder of kidney and ureter, unspecified: Secondary | ICD-10-CM

## 2021-01-04 DIAGNOSIS — G894 Chronic pain syndrome: Secondary | ICD-10-CM

## 2021-01-04 DIAGNOSIS — G620 Drug-induced polyneuropathy: Secondary | ICD-10-CM

## 2021-01-04 DIAGNOSIS — N1832 Chronic kidney disease, stage 3b: Secondary | ICD-10-CM

## 2021-01-04 DIAGNOSIS — G893 Neoplasm related pain (acute) (chronic): Secondary | ICD-10-CM

## 2021-01-04 NOTE — Progress Notes (Signed)
Patient did not show up to her appointment today.  According to the patient she was diagnosed with stage IV gallbladder cancer.  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  She indicates having been given a prescription for oxycodone IR 5 mg that she can take up to 1 tablet p.o. every 4 hours as needed, but she indicates only taking 1 to 2 tablets/week.  In addition to that she was given Lyrica 150 mg p.o. twice daily which she indicates she only takes when she hurts.  She refers that her oncologist is Brittany Mack at [facility]  She apparently last saw him around June 2021.  The patient returns today for review of the ordered tests.  No imaging test ordered.  Lab work indicates the patient to have an elevated CRP, sed rate, vitamin B12 levels, serum creatinine, with decrease glomerular filtration rate.  In addition it shows elevated alkaline phosphatase and AST levels.   Normal ALP (Alkaline phosphatase) levels are between 35 -105 IU/L, for our Lab. High ALP could suggest liver damage or increased bone cell activity. If other tests such as bilirubin, aspartate aminotransferase (AST), or alanine aminotransferase (ALT) are also high, usually the increased ALP is coming from the liver. Higher-than-normal ALP levels can be seen with: biliary obstruction; bone conditions; osteoblastic bone tumors; osteomalacia; a healing fracture; liver disease; hepatitis; eating a fatty meal if you have blood type O or B; hyperparathyroidism; leukemia; lymphoma; Paget disease; rickets; and/or sarcoidosis.  Normal levels of AST are between 5 and 40 U/L. Pregnancy, a muscle injection, or even strenuous exercise may increase AST levels. Acute burns, surgery, and seizures may raise AST levels as well. Very high levels of AST (> 10 X normal) are usually due to acute hepatitis. Levels > 100 X normal can be seen with liver exposure to hepatotoxic  substances. Moderate increases may be seen in other diseases of the liver, especially when the bile ducts are blocked, or with cirrhosis or certain cancers of the liver. AST may also increase after heart attacks and with muscle injury, usually to a much greater degree than ALT. In most types of liver disease, the ALT level is higher than AST and the AST/ALT ratio will be low (less than 1). With heart or muscle injury, AST is often much higher than ALT (often 3-5 times as high) and levels tend to stay higher than ALT for longer than with liver injury.  Normal Creatinine levels are between 0.5 and 0.9 mg/dl for our lab. Any condition that impairs the function of the kidneys is likely to raise the creatinine level in the blood. The most common causes of longstanding (chronic) kidney disease in adults are high blood pressure and diabetes. Other causes of elevated blood creatinine levels include drugs, ingestion of a large amount of dietary meat, kidney infections, rhabdomyolysis (abnormal muscle breakdown), and urinary tract obstruction.  Test: CRP (C-reactive protein) levels Finding(s): Elevated  Normal Level(s): Less than <1.0 mg/dL. Clinical significance: C-reactive protein (CRP) is produced by the liver. The level of CRP rises when there is inflammation throughout the body. CRP goes up in response to inflammation. High levels suggests the presence of chronic inflammation but do not identify its location or cause. Drops of previously elevated levels suggest that the inflammation or infection is subsiding and/or responding to treatment. Signs and symptoms may include: Signs or symptoms, if present, would depend on the underlying inflammatory condition that is the  cause of the elevated CRP level. Possible causes:  - Most common: High levels have been observed in obese patients, individuals with bacterial infections, chronic inflammation, or flare-ups of inflammatory conditions. Patient Recommendation(s):  Unless contraindicated, consider the use of anti-inflammatory diet and medications. (visit http://inflammationfactor.com/ )  Test: ESR (Erythrocyte Sedimentation Rate) levels Finding(s): Elevated  Normal Level(s): below 30 mm/hr. Clinical significance: The sed rate is an acute phase reactant that indirectly measures the degree of inflammation present in the body. It can be acute, developing rapidly after trauma, injury or infection, for example, or can occur over an extended time (chronic) with conditions such as autoimmune diseases or cancer. The ESR is not diagnostic; it is a non-specific, screening test that may be elevated in a number of these different conditions. It provides general information about the presence or absence of an inflammatory condition. Signs and symptoms may include: None Possible causes:  - Most common: inflammation Patient Recommendation(s): Unless contraindicated, consider the use of anti-inflammatory diet and medications. (visit http://inflammationfactor.com/ )  The combined elevation of the ESR & CRP, may be suggestive of an autoimmune disease. Patients with elevated levels of both should consider an evaluation by a Rheumatologist.  Normal Vitamin B-12 level(s): are between 180 and 914 pg/mL.  Elevated Vit B-12 level(s): Levels above 914 pg/mL. Possible causes: Taking supplements of vitamin B-12 (cobalamin). Medical conditions that can increase levels of vitamin B12 include: liver disease, kidney failure and myeloproliferative disorders, which includes myelocytic leukemia and polycythemia vera. Recommendations: Stop vitamin B-12 supplements. Contact primary care physician for further evaluation and recommendations.  Unfortunately, there is not much that I can offer this patient in terms of interventional therapies at this time.   In considering the treatment plan options, Brittany Mack was reminded that I no longer take patients for medication management only. I asked  her to let me know if she had no intention of taking advantage of the interventional therapies, so that we could make arrangements to provide this space to someone interested. I also made it clear that undergoing interventional therapies for the purpose of getting pain medications is very inappropriate on the part of a patient, and it will not be tolerated in this practice. This type of behavior would suggest true addiction and therefore it requires referral to an addiction specialist.

## 2021-02-12 NOTE — Progress Notes (Deleted)
No-show to second visit on 02/13/2021

## 2021-02-13 ENCOUNTER — Ambulatory Visit: Payer: Medicaid Other | Admitting: Pain Medicine

## 2022-05-24 ENCOUNTER — Encounter: Payer: Self-pay | Admitting: Radiology
